# Patient Record
Sex: Female | Born: 1935 | Race: Black or African American | Hispanic: No | Smoking: Current every day smoker
Health system: Southern US, Community
[De-identification: ages and names within clinical notes are randomized; demographics above are authoritative.]

## PROBLEM LIST (undated history)

## (undated) DIAGNOSIS — K219 Gastro-esophageal reflux disease without esophagitis: Secondary | ICD-10-CM

## (undated) DIAGNOSIS — I10 Essential (primary) hypertension: Secondary | ICD-10-CM

## (undated) DIAGNOSIS — F0390 Unspecified dementia without behavioral disturbance: Secondary | ICD-10-CM

## (undated) DIAGNOSIS — I82409 Acute embolism and thrombosis of unspecified deep veins of unspecified lower extremity: Secondary | ICD-10-CM

## (undated) HISTORY — PX: APPENDECTOMY: SHX54

## (undated) HISTORY — PX: ABDOMINAL HYSTERECTOMY: SHX81

---

## 1999-04-18 ENCOUNTER — Emergency Department (HOSPITAL_COMMUNITY): Admission: EM | Admit: 1999-04-18 | Discharge: 1999-04-18 | Payer: Self-pay | Admitting: Emergency Medicine

## 1999-04-18 ENCOUNTER — Encounter: Payer: Self-pay | Admitting: Emergency Medicine

## 1999-04-28 ENCOUNTER — Emergency Department (HOSPITAL_COMMUNITY): Admission: EM | Admit: 1999-04-28 | Discharge: 1999-04-28 | Payer: Self-pay | Admitting: Emergency Medicine

## 2001-10-09 ENCOUNTER — Encounter: Admission: RE | Admit: 2001-10-09 | Discharge: 2001-10-09 | Payer: Self-pay | Admitting: Cardiology

## 2001-10-09 ENCOUNTER — Encounter: Payer: Self-pay | Admitting: Cardiology

## 2001-12-26 ENCOUNTER — Encounter: Payer: Self-pay | Admitting: Cardiology

## 2001-12-26 ENCOUNTER — Ambulatory Visit (HOSPITAL_COMMUNITY): Admission: RE | Admit: 2001-12-26 | Discharge: 2001-12-26 | Payer: Self-pay | Admitting: Cardiology

## 2002-01-01 ENCOUNTER — Ambulatory Visit (HOSPITAL_COMMUNITY): Admission: RE | Admit: 2002-01-01 | Discharge: 2002-01-01 | Payer: Self-pay | Admitting: Cardiology

## 2002-03-17 ENCOUNTER — Emergency Department (HOSPITAL_COMMUNITY): Admission: EM | Admit: 2002-03-17 | Discharge: 2002-03-17 | Payer: Self-pay | Admitting: Emergency Medicine

## 2002-03-17 ENCOUNTER — Encounter: Payer: Self-pay | Admitting: Emergency Medicine

## 2003-06-25 ENCOUNTER — Encounter: Admission: RE | Admit: 2003-06-25 | Discharge: 2003-06-25 | Payer: Self-pay | Admitting: Cardiology

## 2003-06-25 ENCOUNTER — Encounter: Payer: Self-pay | Admitting: Cardiology

## 2008-10-30 ENCOUNTER — Emergency Department (HOSPITAL_COMMUNITY): Admission: EM | Admit: 2008-10-30 | Discharge: 2008-10-31 | Payer: Self-pay | Admitting: Emergency Medicine

## 2009-03-24 ENCOUNTER — Inpatient Hospital Stay (HOSPITAL_COMMUNITY): Admission: EM | Admit: 2009-03-24 | Discharge: 2009-04-01 | Payer: Self-pay | Admitting: Emergency Medicine

## 2009-05-14 ENCOUNTER — Emergency Department (HOSPITAL_COMMUNITY): Admission: EM | Admit: 2009-05-14 | Discharge: 2009-05-14 | Payer: Self-pay | Admitting: Emergency Medicine

## 2009-05-21 ENCOUNTER — Emergency Department (HOSPITAL_COMMUNITY): Admission: EM | Admit: 2009-05-21 | Discharge: 2009-05-21 | Payer: Self-pay | Admitting: Emergency Medicine

## 2009-06-06 ENCOUNTER — Emergency Department (HOSPITAL_COMMUNITY): Admission: EM | Admit: 2009-06-06 | Discharge: 2009-06-07 | Payer: Self-pay | Admitting: Emergency Medicine

## 2009-06-14 ENCOUNTER — Ambulatory Visit (HOSPITAL_COMMUNITY): Admission: RE | Admit: 2009-06-14 | Discharge: 2009-06-14 | Payer: Self-pay | Admitting: Orthopedic Surgery

## 2010-11-14 IMAGING — CR DG HUMERUS 2V *L*
4 series · 4 of 4 positions shown · non-contrast
Comparison: None available.

CLINICAL DATA: Fall, pain.

LEFT HUMERUS - 2+ VIEW

[w humerus ap left *]
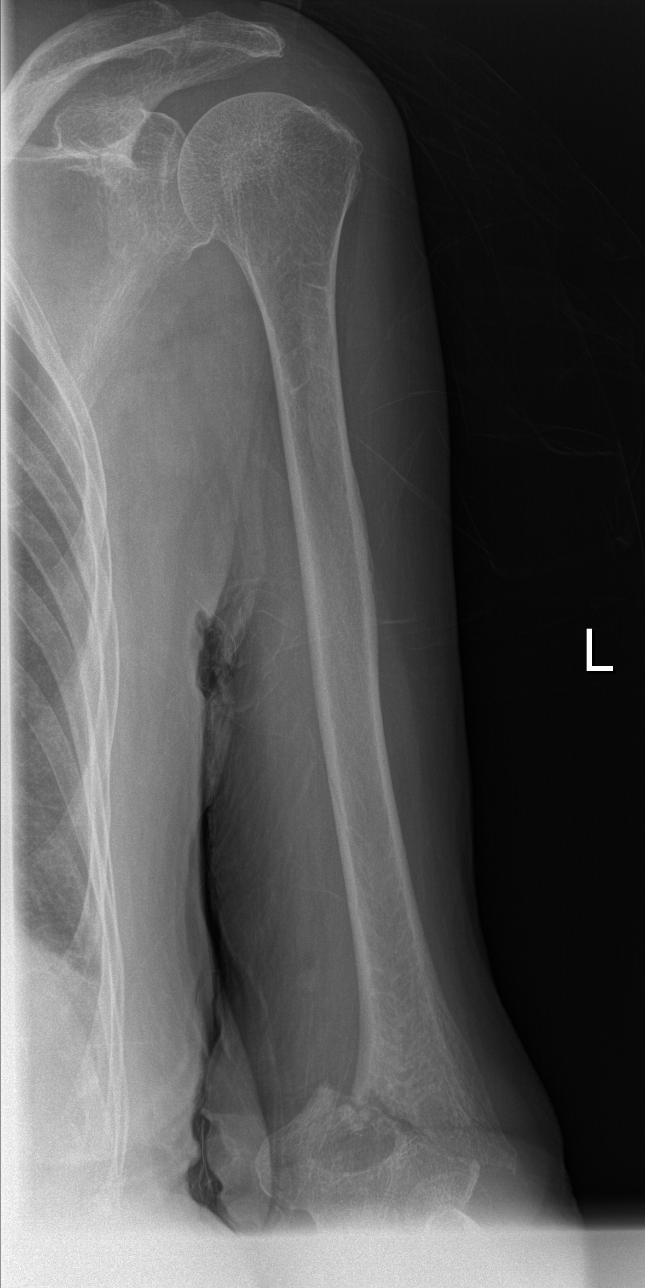

[w humerus lat left *]
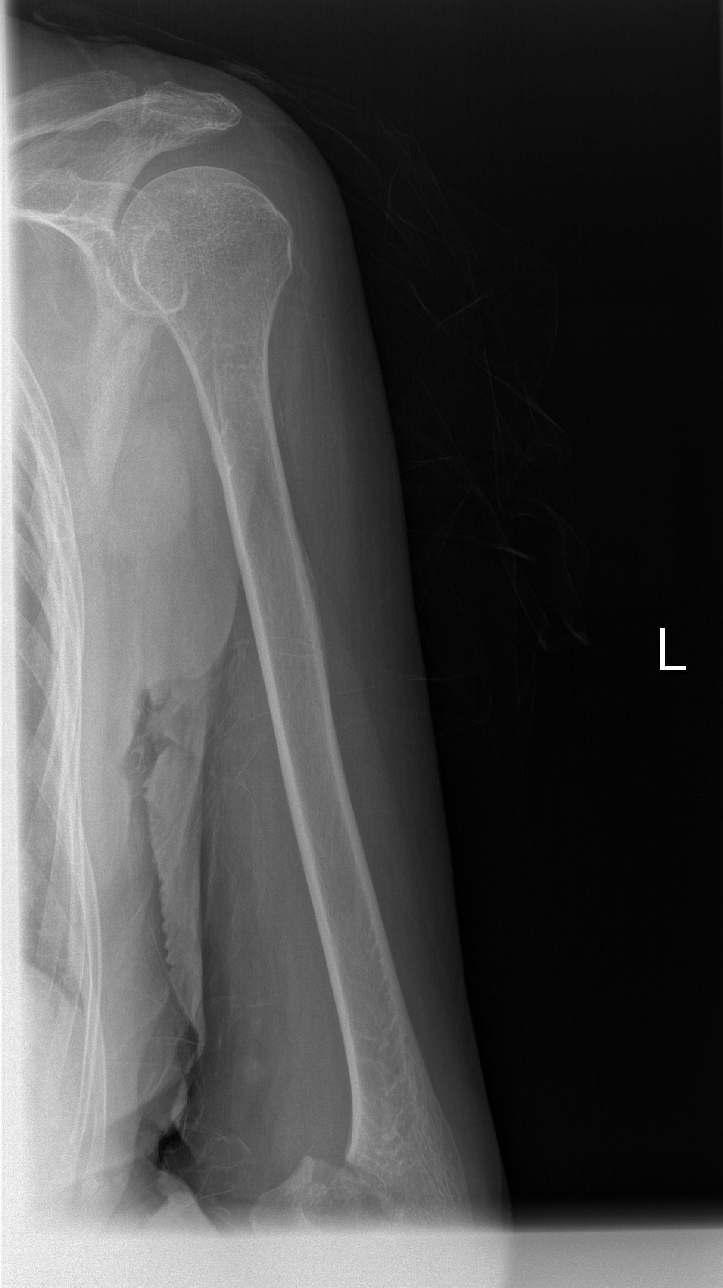

[view not recorded (1 of 2)]
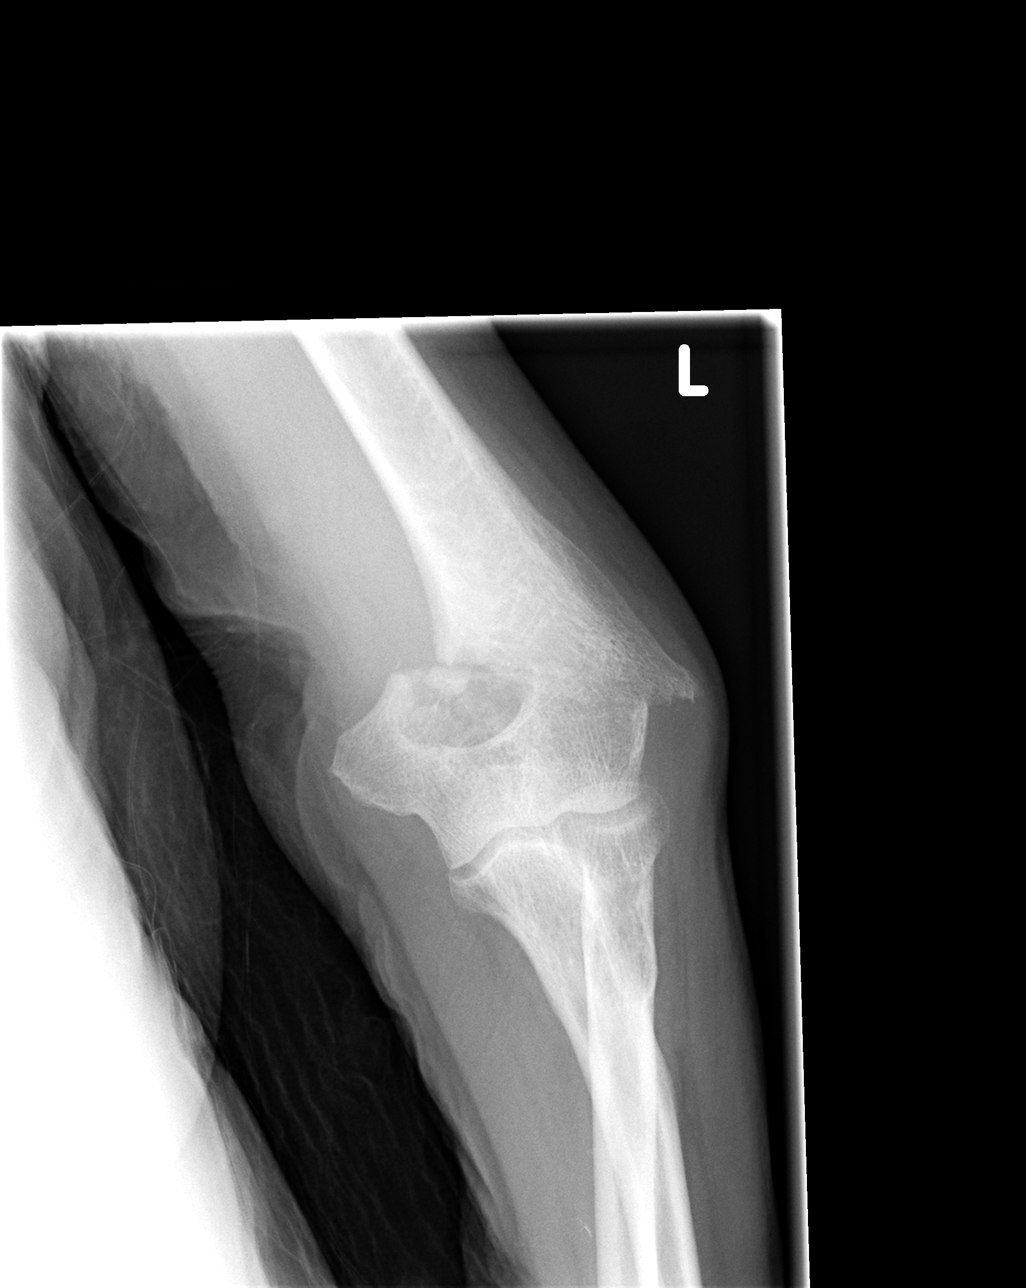

[view not recorded (2 of 2)]
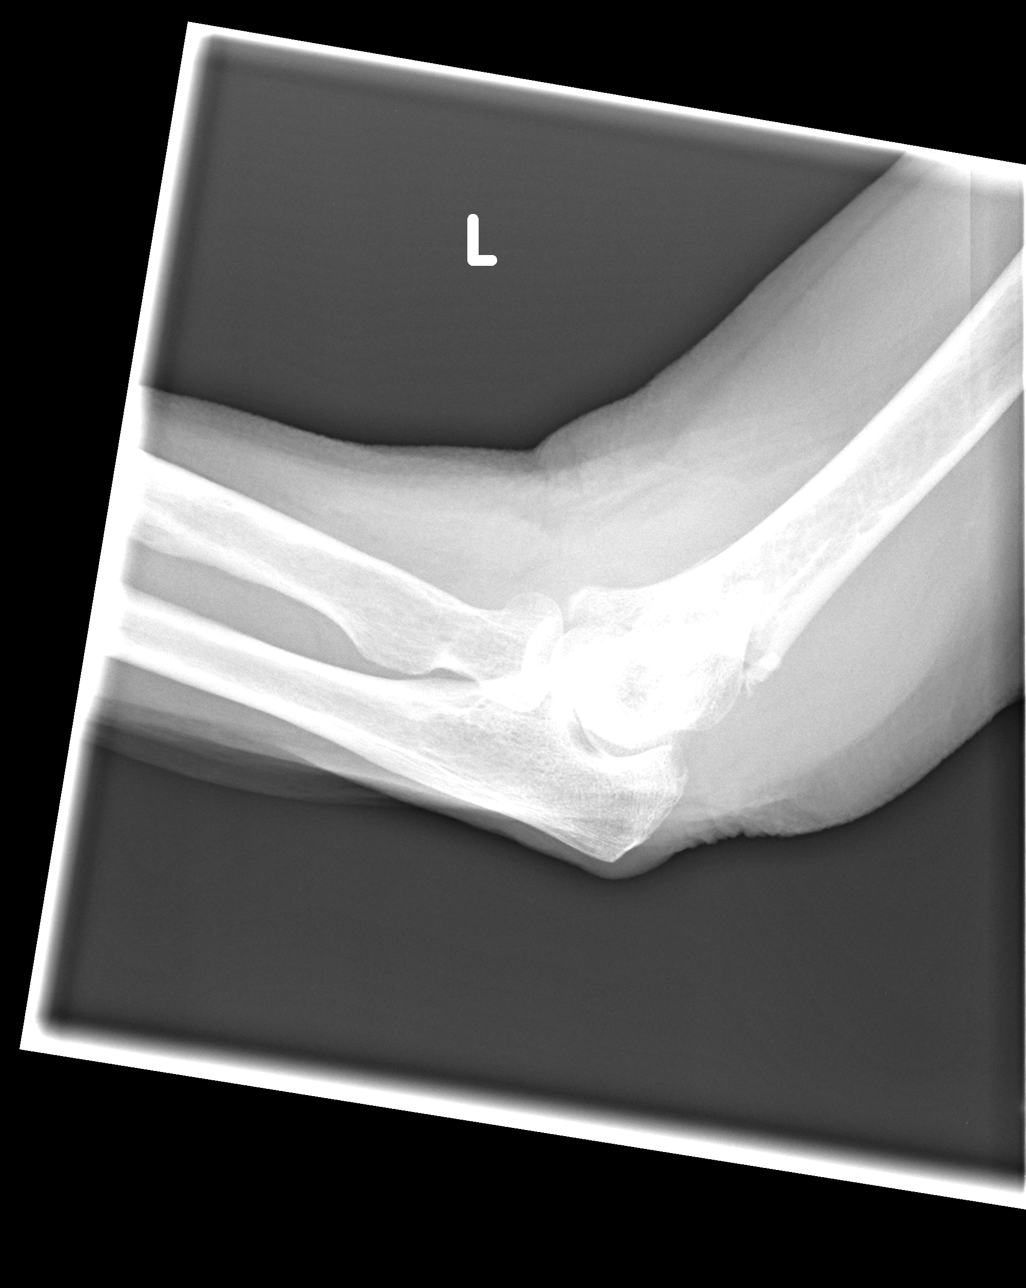

[4 of 4 positions shown; findings below may reference images not displayed]

FINDINGS: The patient has a supracondylar fracture of the distal
left humerus with approximately 1.2 cm medial displacement.
Associated soft tissue swelling is noted.  Elbow joint effusion is
seen.
IMPRESSION: Supracondylar fracture distal left humerus.

## 2010-11-29 ENCOUNTER — Encounter
Admission: RE | Admit: 2010-11-29 | Discharge: 2010-11-29 | Payer: Self-pay | Source: Home / Self Care | Attending: Cardiology | Admitting: Cardiology

## 2010-12-19 ENCOUNTER — Inpatient Hospital Stay (HOSPITAL_COMMUNITY)
Admit: 2010-12-19 | Discharge: 2010-12-22 | DRG: 641 | Disposition: A | Discharge status: Home Health | Payer: Medicare Other | Source: Ambulatory Visit | Attending: Cardiology | Admitting: Cardiology

## 2010-12-19 ENCOUNTER — Inpatient Hospital Stay (HOSPITAL_COMMUNITY): Payer: Medicare Other

## 2010-12-19 DIAGNOSIS — R222 Localized swelling, mass and lump, trunk: Secondary | ICD-10-CM | POA: Diagnosis present

## 2010-12-19 DIAGNOSIS — E46 Unspecified protein-calorie malnutrition: Principal | ICD-10-CM | POA: Diagnosis present

## 2010-12-19 DIAGNOSIS — M25579 Pain in unspecified ankle and joints of unspecified foot: Secondary | ICD-10-CM | POA: Diagnosis not present

## 2010-12-19 DIAGNOSIS — F0390 Unspecified dementia without behavioral disturbance: Secondary | ICD-10-CM | POA: Diagnosis present

## 2010-12-19 DIAGNOSIS — R64 Cachexia: Secondary | ICD-10-CM | POA: Diagnosis present

## 2010-12-19 DIAGNOSIS — I10 Essential (primary) hypertension: Secondary | ICD-10-CM | POA: Diagnosis present

## 2010-12-19 DIAGNOSIS — F17200 Nicotine dependence, unspecified, uncomplicated: Secondary | ICD-10-CM | POA: Diagnosis present

## 2010-12-19 DIAGNOSIS — I959 Hypotension, unspecified: Secondary | ICD-10-CM | POA: Diagnosis present

## 2010-12-19 LAB — COMPREHENSIVE METABOLIC PANEL
Alkaline Phosphatase: 160 U/L — ABNORMAL HIGH (ref 39–117)
BUN: 3 mg/dL — ABNORMAL LOW (ref 6–23)
Chloride: 103 mEq/L (ref 96–112)
Glucose, Bld: 107 mg/dL — ABNORMAL HIGH (ref 70–99)
Potassium: 4.8 mEq/L (ref 3.5–5.1)
Total Bilirubin: 0.2 mg/dL — ABNORMAL LOW (ref 0.3–1.2)

## 2010-12-19 LAB — CBC
HCT: 34.6 % — ABNORMAL LOW (ref 36.0–46.0)
MCV: 91.1 fL (ref 78.0–100.0)
RBC: 3.8 MIL/uL — ABNORMAL LOW (ref 3.87–5.11)
WBC: 4.2 10*3/uL (ref 4.0–10.5)

## 2010-12-20 ENCOUNTER — Inpatient Hospital Stay (HOSPITAL_COMMUNITY): Payer: Medicare Other

## 2010-12-20 LAB — URINALYSIS, ROUTINE W REFLEX MICROSCOPIC
Ketones, ur: NEGATIVE mg/dL
Nitrite: NEGATIVE
Protein, ur: NEGATIVE mg/dL

## 2010-12-20 LAB — PREALBUMIN: Prealbumin: 12.4 mg/dL — ABNORMAL LOW (ref 17.0–34.0)

## 2010-12-20 MED ORDER — IOHEXOL 300 MG/ML  SOLN
100.0000 mL | Freq: Once | INTRAMUSCULAR | Status: AC | PRN
  Administered 2010-12-20: 100 mL via INTRAVENOUS

## 2010-12-21 LAB — BASIC METABOLIC PANEL
BUN: 2 mg/dL — ABNORMAL LOW (ref 6–23)
Chloride: 105 mEq/L (ref 96–112)
Creatinine, Ser: 0.5 mg/dL (ref 0.4–1.2)
GFR calc non Af Amer: >60 mL/min (ref >60)

## 2011-02-12 LAB — APTT: aPTT: 26 seconds (ref 24–37)

## 2011-02-12 LAB — CBC
HCT: 41.9 % (ref 36.0–46.0)
Hemoglobin: 13.9 g/dL (ref 12.0–15.0)
MCHC: 33.1 g/dL (ref 30.0–36.0)
MCV: 94.5 fL (ref 78.0–100.0)
Platelets: 186 10*3/uL (ref 150–400)
RBC: 4.43 MIL/uL (ref 3.87–5.11)
RDW: 19.5 % — ABNORMAL HIGH (ref 11.5–15.5)
WBC: 3.3 10*3/uL — ABNORMAL LOW (ref 4.0–10.5)

## 2011-02-12 LAB — POCT I-STAT, CHEM 8
BUN: 6 mg/dL (ref 6–23)
Calcium, Ion: 1.08 mmol/L — ABNORMAL LOW (ref 1.12–1.32)
Chloride: 95 mEq/L — ABNORMAL LOW (ref 96–112)
Creatinine, Ser: 0.5 mg/dL (ref 0.4–1.2)
Glucose, Bld: 89 mg/dL (ref 70–99)
HCT: 43 % (ref 36.0–46.0)
Hemoglobin: 14.6 g/dL (ref 12.0–15.0)
Potassium: 4.1 mEq/L (ref 3.5–5.1)
Sodium: 130 mEq/L — ABNORMAL LOW (ref 135–145)
TCO2: 22 mmol/L (ref 0–100)

## 2011-02-12 LAB — BASIC METABOLIC PANEL
CO2: 25 mEq/L (ref 19–32)
Calcium: 9 mg/dL (ref 8.4–10.5)
Creatinine, Ser: 0.54 mg/dL (ref 0.4–1.2)
GFR calc Af Amer: >60 mL/min (ref >60)

## 2011-02-12 LAB — PROTIME-INR: Prothrombin Time: 12.8 seconds (ref 11.6–15.2)

## 2011-02-12 LAB — ETHANOL: Alcohol, Ethyl (B): 223 mg/dL — ABNORMAL HIGH (ref 0–10)

## 2011-02-14 LAB — BASIC METABOLIC PANEL
BUN: 11 mg/dL (ref 6–23)
BUN: 2 mg/dL — ABNORMAL LOW (ref 6–23)
BUN: 5 mg/dL — ABNORMAL LOW (ref 6–23)
CO2: 24 mEq/L (ref 19–32)
Calcium: 8.7 mg/dL (ref 8.4–10.5)
Calcium: 8.9 mg/dL (ref 8.4–10.5)
Chloride: 104 mEq/L (ref 96–112)
Chloride: 105 mEq/L (ref 96–112)
Creatinine, Ser: 0.51 mg/dL (ref 0.4–1.2)
Creatinine, Ser: 0.52 mg/dL (ref 0.4–1.2)
Creatinine, Ser: 0.69 mg/dL (ref 0.4–1.2)
GFR calc Af Amer: >60 mL/min (ref >60)
GFR calc non Af Amer: >60 mL/min (ref >60)
Glucose, Bld: 80 mg/dL (ref 70–99)
Glucose, Bld: 88 mg/dL (ref 70–99)
Potassium: 3.9 mEq/L (ref 3.5–5.1)

## 2011-02-14 LAB — DIFFERENTIAL
Blasts: 0 %
Lymphocytes Relative: 21 % (ref 12–46)
Lymphs Abs: 0.9 10*3/uL (ref 0.7–4.0)
Myelocytes: 0 %
Neutro Abs: 3.2 10*3/uL (ref 1.7–7.7)
Neutrophils Relative %: 79 % — ABNORMAL HIGH (ref 43–77)
Promyelocytes Absolute: 0 %

## 2011-02-14 LAB — CROSSMATCH

## 2011-02-14 LAB — CBC
HCT: 26.1 % — ABNORMAL LOW (ref 36.0–46.0)
HCT: 27.9 % — ABNORMAL LOW (ref 36.0–46.0)
HCT: 30.9 % — ABNORMAL LOW (ref 36.0–46.0)
Hemoglobin: 8.2 g/dL — ABNORMAL LOW (ref 12.0–15.0)
Hemoglobin: 8.9 g/dL — ABNORMAL LOW (ref 12.0–15.0)
MCHC: 29.5 g/dL — ABNORMAL LOW (ref 30.0–36.0)
MCHC: 31.4 g/dL (ref 30.0–36.0)
MCHC: 31.5 g/dL (ref 30.0–36.0)
MCHC: 31.8 g/dL (ref 30.0–36.0)
MCV: 76.3 fL — ABNORMAL LOW (ref 78.0–100.0)
MCV: 77.2 fL — ABNORMAL LOW (ref 78.0–100.0)
MCV: 77.6 fL — ABNORMAL LOW (ref 78.0–100.0)
MCV: 78.5 fL (ref 78.0–100.0)
MCV: 78.9 fL (ref 78.0–100.0)
Platelets: 105 10*3/uL — ABNORMAL LOW (ref 150–400)
Platelets: 105 10*3/uL — ABNORMAL LOW (ref 150–400)
Platelets: 173 10*3/uL (ref 150–400)
Platelets: 214 10*3/uL (ref 150–400)
RBC: 3.43 MIL/uL — ABNORMAL LOW (ref 3.87–5.11)
RBC: 3.68 MIL/uL — ABNORMAL LOW (ref 3.87–5.11)
RBC: 4.08 MIL/uL (ref 3.87–5.11)
RDW: 25.6 % — ABNORMAL HIGH (ref 11.5–15.5)
RDW: 25.8 % — ABNORMAL HIGH (ref 11.5–15.5)
WBC: 5.1 10*3/uL (ref 4.0–10.5)
WBC: 5.5 10*3/uL (ref 4.0–10.5)
WBC: 6.1 10*3/uL (ref 4.0–10.5)

## 2011-02-14 LAB — POCT CARDIAC MARKERS: Myoglobin, poc: 47.1 ng/mL (ref 12–200)

## 2011-02-14 LAB — HEMOCCULT GUIAC POC 1CARD (OFFICE): Fecal Occult Bld: NEGATIVE

## 2011-02-14 LAB — RETICULOCYTES
RBC.: 3.03 MIL/uL — ABNORMAL LOW (ref 3.87–5.11)
Retic Count, Absolute: 24.2 10*3/uL (ref 19.0–186.0)
Retic Ct Pct: 0.8 % (ref 0.4–3.1)

## 2011-02-14 LAB — URINALYSIS, ROUTINE W REFLEX MICROSCOPIC
Bilirubin Urine: NEGATIVE
Ketones, ur: 15 mg/dL — AB
Nitrite: NEGATIVE
Protein, ur: NEGATIVE mg/dL
Specific Gravity, Urine: 1.012 (ref 1.005–1.030)
Urobilinogen, UA: 1 mg/dL (ref 0.0–1.0)

## 2011-02-14 LAB — URINE MICROSCOPIC-ADD ON

## 2011-02-14 LAB — PROTIME-INR: Prothrombin Time: 14.9 seconds (ref 11.6–15.2)

## 2011-02-14 LAB — BRAIN NATRIURETIC PEPTIDE: Pro B Natriuretic peptide (BNP): 58 pg/mL (ref 0.0–100.0)

## 2011-02-14 LAB — IRON AND TIBC: Iron: 44 ug/dL (ref 42–135)

## 2011-02-20 NOTE — Discharge Summary (Signed)
  NAMEMIKAELA, HILGEMAN                ACCOUNT NO.:  [id number]  MEDICAL RECORD NO.:  [id number]           PATIENT TYPE:  I  LOCATION:  2023                         FACILITY:  [facility]  PHYSICIAN:  Osvaldo Shipper. Percell Lamboy, M.D.DATE OF BIRTH:  [birth year]  DATE OF ADMISSION:  12/19/2010 DATE OF DISCHARGE:  12/22/2010                              DISCHARGE SUMMARY   DISCHARGE DIAGNOSES: 1. Lung mass. 2. Malnutrition. 3. Dementia. 4. Tobacco abuse. 5. Hypotension.  Ms. Palladino is an 76 year old patient who has the history of loss of appetite.  During an outpatient evaluation, the patient was found to have a left upper lobe lung lesion that was suspicious for carcinoma. The patient also had cough, dyspnea, weakness, and she is subsequently admitted for evaluation and treatment of this particular problem.  It is of note that on examination, the patient's lungs were mostly clear. There were no wheezes.  There were few rhonchi.  Otherwise, examination was essentially within normal range.  HOSPITAL COURSE:  The patient was admitted to the Medical Service.  The patient was placed on IV antibiotic therapy and was cautiously hydrated.  The patient had a CT scan of the chest, which revealed a 7 x 15 mm left upper lobe suprapleural opacity.  There was also a 1 x 2 cm prevascular lymph node and evidence of mild emphysema.  It was suggested that the patient have a followup CT in 6 months or a PET CT.  There were also some problems with malnutrition and confusion.  The patient was placed on Aricept 5 mg daily.  A consultation was obtained with PT and Occupational Therapy.  On December 22, 2010, it was the opinion that the patient had received maximum benefit from this hospitalization and could be discharged home with completion of her workup as an outpatient.  MEDICATIONS AT DISCHARGE: 1. Acetaminophen 650 mg every 4 hours as needed. 2. Aricept 5 mg daily. 3. Ferrous sulfate 325 mg daily. 4.  Multiple vitamin daily.  The patient is to stop the amlodipine and benazepril for now, and this will be reassessed upon evaluation in the office.  The patient is to notify the physician immediately if any changes, problems, or concerns.     Ivery Quale, P.A.   ______________________________ Osvaldo Shipper. Stefanee Mckell, M.D.    HB/MEDQ  D:  01/04/2011  T:  01/05/2011  Job:  [id number]  Electronically Signed by Ivery Quale P.A. on 01/12/2011 08:32:23 AM Electronically Signed by Donia Guiles M.D. on 02/20/2011 08:42:47 PM

## 2011-03-21 NOTE — Op Note (Signed)
NAMEAMARIANA, Ann Kelley             ACCOUNT NO.:  [id number]   MEDICAL RECORD NO.:  [id number]          PATIENT TYPE:  AMB   LOCATION:  SDS                          FACILITY:  [facility]   PHYSICIAN:  Feliberto Gottron. Turner Daniels, M.D.   DATE OF BIRTH:  12-30-1925   DATE OF PROCEDURE:  06/14/2009  DATE OF DISCHARGE:  06/14/2009                               OPERATIVE REPORT   PREOPERATIVE DIAGNOSIS:  Comminuted left elbow supracondylar fracture.   POSTOPERATIVE DIAGNOSIS:  Comminuted left elbow supracondylar fracture.   PROCEDURE:  Open reduction and internal fixation using DePuy elbow  plating system, 7-hole posterolateral plate, and a 7-hole medial plate.   SURGEON:  Feliberto Gottron. Turner Daniels, MD   FIRST ASSISTANT:  Shirl Harris, PA-C   ANESTHETIC:  General endotracheal.   ESTIMATED BLOOD LOSS:  Minimal.   FLUID REPLACEMENT:  1200 mL of crystalloid.   DRAINS PLACED:  None.   TOURNIQUET TIME:  1 hour and 50 minutes.   INDICATIONS FOR PROCEDURE:  An 77 year old woman who fell about a week  ago and sustained a closed left elbow supracondylar fracture that did  not involve the intra-articular surface but was comminuted at the  fracture site just above the olecranon fossa.  It had a slight oblique  angle to it and was grossly unstable.  We initially we were going to do  [facility] a few days after her injury, however, Anesthesia  evaluation felt that she be more suitable for surgery at the [facility].  Therefore, surgery is done at the [facility] on June 14, 2009.  Risks and benefits of surgery were discussed with the patient and  with her daughter who is her caretaker and the goal is to give her a  stable elbow with rigid fixation to allow relatively early motion.   DESCRIPTION OF PROCEDURE:  The patient was identified by armband and  received preoperative IV antibiotics in the holding area at [facility], taken to operating room 1.  Appropriate anesthetic monitors  were  attached and general endotracheal anesthesia induced with the  patient in supine position.  Tourniquet applied high to the left upper  extremity which was then prepped and draped in usual sterile fashion  from the wrist to the hemithorax.  Time-out procedure was performed and  then a Mayo stand was brought from the other side of the table, allowing  [place] to put the forearm in the Mayo stand holding the elbow flexed  anywhere from about 90-95 degrees.  The limb was wrapped with an Esmarch  bandage.  The tourniquet inflated to 300 mmHg.  We began the operation  by making a posterior midline incision starting about 6-7 cm distal to  the olecranon tip going over the olecranon and then proximal for another  10-12 cm through the skin and subcutaneous tissue to the fascia  investing the ulna and the triceps.  The fascia was then divided and  reflected medially and laterally exposing the proximal ulna, olecranon,  and the triceps tendon.  We then exploited the interval between the  triceps tendon laterally and  the lateral intermuscular septum getting [place]  down to the fracture site.  There was already some granulation tissue  which was removed.  We then went to the medial side.  I released the  ulnar nerve and transposed it anteriorly using a vessel loop.  We then  split the interval between the triceps and the medial intermuscular  septum exposing the fracture medially.  We were able to visualize the  fracture well enough from the medial and lateral side to perform a  reduction under direct vision.  It was then held with 0.62 K-wires going  up the medial and lateral columns.  Satisfied with the reduction, we  then selected a 7-hole posterolateral plate from the DePuy elbow set and  applied it to the posterior aspect of the lateral column and fixed it  their provisionally with the central gliding hole with a 3.5 screw with  a washer.  Satisfied with the position, we then placed 2 locking screws  from  the plate into the capitellum obtaining good firm fixation.  We  then placed another 4 screws proximally bicortical fixing the plate to  the lateral column and once this had been accomplished, the elbow was  actually quite rigidly fixed.  At this point, the lateral K-wire was  removed.  Moving to the medial side, we again checked our reduction and  then selected a 7-hole medial plate from the elbow plating set, applied  it to the medial cortex again with a gliding hole to set our location  over the medial epicondyle, and then placed 3 medial epicondyle screws  under C-arm imaging control, obtaining good firm fixation of the medial  column and then filled in the rest of the screw holes proximally on the  plate with bicortical screws except for the most proximal hole giving [place]  8 cortices proximally and 6 distally.  The elbow was then taken through  range of motion, and no impingement was noted.  We were able to get a  finger up into the anterior aspect of the elbow and confirmed that none  of the screws had penetrated.  C-arm images were taken confirming good  position of the plates and screws.  At this point, the tourniquet was  let down.  Small bleeders were identified and cauterized.  The  subcutaneous tissue was closed with running 2-0 Vicryl suture, and skin  with running interlocking 3-0 nylon suture.  A dressing of Xeroform, 4x4  dressing, sponges, Webril, and an Ace wrap was applied followed by a  sling.  The patient was then awakened and taken to the recovery room  without difficulty.      Feliberto Gottron. Turner Daniels, M.D.  Electronically Signed     FJR/MEDQ  D:  06/14/2009  T:  06/15/2009  Job:  [id number]

## 2011-03-21 NOTE — Op Note (Signed)
Ann Kelley, Ann Kelley             ACCOUNT NO.:  [id number]   MEDICAL RECORD NO.:  [id number]          PATIENT TYPE:  INP   LOCATION:  1236                         FACILITY:  [facility]   PHYSICIAN:  Bernette Redbird, M.D.   DATE OF BIRTH:  [birth year]   DATE OF PROCEDURE:  03/26/2009  DATE OF DISCHARGE:                               OPERATIVE REPORT   PROCEDURE:  Upper endoscopy.   INDICATIONS:  An 77 year old African American female with profound  microcytic anemia on admission, recurrently Hemoccult negative, history  of significant aspirin exposure and moderate ethanol consumption.   FINDINGS:  Erythema in the antral region and prepyloric region  suggestive of gastric antral vascular ectasia.  No erosive changes,  ulcers or cancer seen.   PROCEDURE:  The patient's daughter provided written consent on her  mother's behalf.  The patient was brought from her hospital room to the  endoscopy unit.  No sedation was administered because the patient was  already quite somnolent from being on an Ativan and alcohol withdrawal  protocol.  With the patient in the left lateral decubitus position, the  Pentax adult video endoscope was passed under direct vision.  It entered  the esophagus without difficulty.  The vocal cords were not well seen.   The esophagus was pertinent for a widely patent esophageal mucosal ring  (Schatzki's ring) at the squamocolumnar junction.  No varices, reflux  esophagitis, Barrett's esophagus, infection or neoplasia were noted in  the esophagus.   A small hiatal hernia was present.   The stomach contained no blood or coffee-ground material.   The main finding on examination of the stomach was intense erythematous  mucosal change in the prepyloric and antral region.  This had a somewhat  reticular pattern, consistent with gastric antral vascular ectasia.  I  worked carefully but did not see any erosive changes or ulcers, nor did  I see any polyps or masses, including  a retroflexed view of the cardia.  There was a little bit of patchy erythema in the midbody of the stomach.  The duodenal bulb and second duodenum were unremarkable.   The scope was removed from the patient.  No biopsies were obtained.  She  tolerated the procedure well, and there were no apparent complications.   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   PLAN:  1. I have discussed the case with her primary physician, Dr. Kevin Fenton.      Spruill.  We both feel that colonoscopic evaluation would be      appropriate if we can get the patient's daughter to agree to it.      This would have to be deferred until the patient is somewhat more      lucid.  2. The observed findings are not characteristic of a typical peptic      process.  Nonetheless, aspirin avoidance and chronic PPI therapy      might help diminish blood loss from the stomach.  3. It is unclear whether or not the observed findings account for the  patient's anemia, but I think that there is a      good chance that is the case.  4. Management of this patient's anemia over the long haul will      probably need to include iron supplementation and periodic      hemoglobin determinations.           ______________________________  Bernette Redbird, M.D.     RB/MEDQ  D:  03/26/2009  T:  03/26/2009  Job:  [id number]   cc:   Osvaldo Shipper. Spruill, M.D.  Fax: [phone]

## 2011-03-21 NOTE — H&P (Signed)
Ann Kelley, Ann Kelley             ACCOUNT NO.:  [id number]   MEDICAL RECORD NO.:  [id number]          PATIENT TYPE:  INP   LOCATION:  0113                         [facility]ACILITY:  [facility]   PHYSICIAN:  Osvaldo Shipper. Spruill, M.D.DATE OF BIRTH:  [birth year]   DATE OF ADMISSION:  03/24/2009  DATE OF DISCHARGE:                              HISTORY & PHYSICAL   CHIEF COMPLAINT:  Weakness, dizziness.   HISTORY OF PRESENT ILLNESS:  This 77 year old black woman was brought to  [facility] for further evaluation of severe weakness as well  as history of syncope. The patient's family gives most of the history  and states that for the past 3-4 months their mother has had difficulty  with syncope.  The patient is described as having a severe coughing  paroxysm prior to a syncopal episode which is described as her eyes roll  back in her head and she becomes unresponsive.   The patient has had this for the past 3-4 months and also has had  associated symptoms of profound weakness, dizziness and weight loss as  well as poor appetite.  The patient gives no history of black tarry  stools.  There is no history of gastrointestinal or gum bleeding or  bleeding from any other orifice.   The patient also does not describe any history of [employer] colored  urine and has no history of diabetes.  The patient was initially seen in  January of this year and was recommended to have several studies to  evaluate her history of syncope but she refused to have any studies  done, did not follow up with the planned CT scan of the brain.   The patient was brought to the hospital and was evaluated by the  emergency room physician and found to have a severe anemia with  hemoglobin 5.3.  With this she was referred for admission to the  hospital.  The patient is an extremely poor historian and the majority  of the patient's history is obtained from her family who also reports  that she has had some confusion at times  but no other major problems.  They also reported that she has some mild left lower quadrant pain which  may have occurred during a previous fall.   The patient will be admitted to the hospital for further evaluation of  her anemia and to evaluate her complaints of syncope.   PAST MEDICAL HISTORY:  Significant for previous hypertension.   SOCIAL HISTORY:  She currently lives with one of her daughters and is  not working.   FAMILY HISTORY:  Noncontributory.   REVIEW OF SYSTEMS:  NEUROLOGIC:  As mentioned before in the HPI, recent  syncope with her eyes rolling back in her head.  CARDIOVASCULAR:  No  complaints of chest pain.  RESPIRATORY:  Does complain of recurrent  exertional and at rest dyspnea and a productive cough.  ENDOCRINE:  Unremarkable.  DERMATOLOGIC:  There are no complaints of any skin rash,  no bruises.  GASTROINTESTINAL:  Poor appetite as described above.  No  vomiting.  The remainder of the review of systems  unremarkable.   MEDICATIONS:  Include:  1. Amlodipine/benazepril 10/20 one daily.  2. Klor-Con 1 p.o. daily.   ALLERGIES:  None known.   PHYSICAL EXAMINATION:  VITAL SIGNS:  Temp 97.6, blood pressure 89/51,  heart rate 78, respiratory rate 18.  O2 saturation 99%.  GENERAL:  A well-developed elderly woman examined in moderate distress.  EXAMINATION OF THE HEAD/EARS/EYES/NOSE/THROAT:  Shows pale conjunctiva.  Sclerae nonicteric.  NECK:  No nuchal rigidity.  No palpable nodes.  No carotid bruit.  No  thyromegaly.  CHEST:  No chest wall tenderness appreciated.  No intercostal  retractions.  The patient has slightly increased expiratory phase of  respiration.  CARDIOVASCULAR:  S1, S2 normal.  No S 3, no rubs, no lifts, no thrills.  ABDOMEN:  Soft, no active bowel sounds.  Unable to elicit any  tenderness.  EXTREMITIES:  No cyanosis, no clubbing, no edema.  The patient does have  some tenderness on the lateral portion of the left thigh.  No bruise  noted.    LABORATORY STUDIES:  The patient's white blood cell count 4.1,  hemoglobin 5.3 gm/dL, hematocrit 16%, MCV 10.9, MCHC 29.5.  Patient's  ABO Rh screen O positive.  Serum sodium 134, potassium 4.4, chloride  105, CO2 of 22, glucose 80, BUN 11, creatinine 0.69, calcium 8.7.  Other  labs are currently pending.   ASSESSMENT:  1. Syncope.  May be secondary to #2.  2. Anemia.  The anemia may be a blood loss type.  Will need to be      evaluated.  3. Hypotension, likely due to severe anemia.  4. Probable chronic obstructive pulmonary disease.  The patient has a      history of smoking two packs per day.  5. Confusion.   PLAN:  1. We will admit the patient to the hospital to evaluate the causes of      anemia.  May include a gastrointestinal evaluation to exclude any      occult blood loss.  2. We will evaluate the patient's brain for other possible causes of      syncope and also to exclude the possibility of subdural hematoma as      she has fallen several times.  3. We will improve patient's pulmonary toilet as well.  4. We will evaluate her loss of appetite.      Osvaldo Shipper. Spruill, M.D.  Electronically Signed     JOS/MEDQ  D:  03/24/2009  T:  03/24/2009  Job:  [id number]

## 2011-03-21 NOTE — Consult Note (Signed)
Ann Kelley, Ann Kelley             ACCOUNT NO.:  [id number]   MEDICAL RECORD NO.:  [id number]          PATIENT TYPE:  INP   LOCATION:  1236                         FACILITY[facility]  [facility]   PHYSICIAN:  Shirley Friar, MDDATE OF BIRTH:  [birth year]   DATE OF CONSULTATION:  03/25/2009  DATE OF DISCHARGE:                                 CONSULTATION   REFERRING PHYSICIAN:  Dr. Donia Guiles.   We were asked to see Ann Kelley today in consult for anemia by Dr. Donia Guiles.   HPI:  This is an 77 year old female who was admitted with a hemoglobin  of 5.3 and an MCV value of 69.  She has had multiple falls at home and  had an episode of syncope.  She is somewhat confused now so Ann Kelley history  comes from Ann Kelley daughter.  She tells me that Ann Kelley never complains  but has had a decrease in Ann Kelley p.o. intake since Ann Kelley husband died 5  months ago.  She has also had significant weight loss.  Ann Kelley  drinks 4 to 5 beers a day in the evening.  She smokes cigarettes  continuously.  She takes at least 3 [facility] Powder daily.  She also tells me  that Ann Kelley lost Ann Kelley husband of 60 years about 5 months ago and has  been very depressed since.  There has been no colon cancer or ulcer  disease in the family.  Ann Kelley past medical history is significant  only for hypertension.  She is a patient of Dr. Donia Guiles but does  not follow with him regularly.   CURRENT MEDICATIONS:  Include:  1. Amlodipine-benazepril 10/20.  2. Potassium 20 mEq daily.  3. BC Powder 3 times daily.   SHE HAS NO KNOWN DRUG ALLERGIES.   REVIEW OF SYSTEMS:  Negative except for recent falls, anorexia, and  syncope.   SOCIAL HISTORY:  Positive for tobacco and beer.  No hard alcohol or  recreational drug use.   FAMILY HISTORY:  Negative for colon cancer and ulcer disease.   PHYSICAL EXAM:  The patient is awake, slightly disgruntled with me for  interrupting Ann Kelley meal, slightly confused.  HEART:  Has a regular rate and  rhythm.  LUNGS:  Clear but the patient does not take deep inspiration.  ABDOMEN:  Soft, nontender, nondistended with good bowel sounds.  RECTAL EXAM:  She does have external nonthrombosed hemorrhoids.  She  also has what feel like internal hemorrhoids.  Ann Kelley rectal vault is full  of soft brown stool that is guaiac negative on my exam.   LABS:  Significant for an admission hemoglobin of 5.3 with an MCV value  of 69.8.  She is status post 2 units of packed red blood cells and Ann Kelley  hemoglobin is now 8.2/hematocrit of 26.1.  Ann Kelley white count is 4.7,  platelets 96,000.  BMET is within normal limits including a BUN of 11,  creatinine 0.69.  Iron level is 44, TIBC is 348.  Percent sat is 13.  She was noted to be guaiac negative on Mar 24, 2009, as well.   ASSESSMENT:  Dr. Charlott Rakes has seen and examined the patient,  collected a history, and reviewed Ann Kelley chart.  His impression is this is  an 77 year old female with syncope, anorexia, and severe microcytic  anemia who was guaiac negative who does take BC Powder.  We are  uncertain of the etiology of Ann Kelley anemia, particularly given Ann Kelley normal  iron levels and guaiac status but secondary to NSAIDs, alcohol, and  tobacco use she definitely needs endoscopic evaluation so we will plan  for upper endoscopy at 8:30 a.m. with Dr. Bernette Redbird on Mar 26, 2009.  We have discussed this case with Doctors Spruill, Schooler, and  Buccini.   Thanks very much for this consultation.      Stephani Police, [place]      Shirley Friar, MD  Electronically Signed    MLY/MEDQ  D:  03/25/2009  T:  03/25/2009  Job:  [id number]   cc:   Osvaldo Shipper. Spruill, M.D.  Fax: [phone]   Shirley Friar, MD  Fax: [phone]

## 2011-03-21 NOTE — Discharge Summary (Signed)
Ann Kelley, Ann Kelley             ACCOUNT NO.:  [id number]   MEDICAL RECORD NO.:  [id number]          PATIENT TYPE:  INP   LOCATION:  1432                         FACILITY:  [facility]   PHYSICIAN:  Osvaldo Shipper. Spruill, M.D.DATE OF BIRTH:  1926-03-08   DATE OF ADMISSION:  03/24/2009  DATE OF DISCHARGE:  04/01/2009                               DISCHARGE SUMMARY   ADMISSION OR PROVISIONAL DIAGNOSES:  1. Syncope.  2. Severe anemia.  3. Hypotension.  4. Chronic obstructive pulmonary disease.  5. Confusion.   DISCHARGE DIAGNOSES:  1. Severe anemia, likely secondary to blood loss.  2. Syncope secondary to orthostatic hypotension.  3. Hypotension.  4. Chronic obstructive pulmonary disease.  5. Alcohol withdrawal.  6. Confusion.  7. Malnutrition.  8. Gastric erosion.   BRIEF HISTORY AND REASON FOR ADMISSION:  This 77 year old black woman  who refused to come to the hospital was eventually brought to the  hospital by her daughters with a history of a syncopal episode. Patient  was evaluated by the emergency room physician and found to have severe  anemia, hemoglobin in the 5 range.  Patient was evaluated by the EDP and  subsequently admitted to the hospital as she was also noted to be quite  hypotensive in the EDP.   The patient had had several syncopal episodes in the past but refused to  come to the hospital for further evaluation.  She has a history of  alcohol abuse, as well as tobacco abuse and refused admission for fear  that these things would be taken from her.   Due to the patient's recurrent syncope and weakness, she was brought to  the hospital by her daughter for further evaluation and treatment and  subsequently admitted.   LABORATORY STUDIES:  Cardiac markers on initial presentation, troponin  and myoglobin were all unremarkable.  The B-natriuretic peptide was 58,  unremarkable.  Patient had a baseline BMET on Mar 24, 2009, revealed  serum sodium 134, potassium 4.4,  chloride 105, CO2 currently at 22,  glucose 88, BUN 11, creatinine 0.69.  CBC on Mar 24, 2009, white blood  cell count 4100, hemoglobin 5.3, hematocrit 18.  The MCV was recorded at  69.8, MCHC was 29.5, and platelet count was 105,000.  Stool for occult  blood was negative.  Patient had a urinalysis on Mar 25, 2009, yellow,  clear 1.02, pH 5.5, negative for glucose or bilirubin, and had trace  ketones.  CBC on Mar 25, 2009, hemoglobin 8.2, hematocrit 26.1.  Reticulocyte count on Mar 24, 2009, was 0.8.  Iron and iron-binding  capacity revealed serum iron 44, iron-binding capacity 348, percent  saturation 13.  Patient was typed and crossed for several units of  blood.  On Mar 26, 2009, BMET was unremarkable and on Mar 31, 2009, the  patient had a CBC, hemoglobin 8.9, hematocrit 27.9, and platelet count  221,000.   RADIOLOGIC STUDIES:  CT scan of the patient's brain to evaluate syncope  and rule out subdural hematoma revealed age-related cerebral atrophy,  ventriculomegaly, no acute intracranial findings or mass lesions,  chronic left maxillary sinusitis.  Chest  x-ray, heart was moderately  enlarged, no pneumothorax, cardiomegaly without pulmonary edema.   HOSPITAL COURSE:  This 77 year old black woman was admitted to the  hospital for further evaluation of syncope and also evaluation of her  anemia.  After admission, the baseline laboratory studies were obtained  and are included in the body of the patient's chart.  The first order of  business was to replace the patient's blood.  She was typed and crossed  and transfusion was actually started in the emergency room.  It was  continued when the patient reached the floor.  She was initially  admitted to the intensive care unit as she was quite hypotensive.  Blood  pressure recorded below 100 systolic.  In fact, the blood pressure  recording in the emergency room was nearing 90/60.   The patient had additional lab studies which included  iron, iron-binding  capacity, reticulocyte count and these were included in the body of the  chart as well.  Patient had a CAT scan of the brain to exclude the  possibility of subdural hematoma as she did give a history of several  falls.  She was also started on Zithromax on initial presentation and  given for 4 days for suspected bronchitis and also given albuterol 2.5  via handheld nebulizer 4 times a day.  Cardiovascular p.r.n. orders were  also ordered.   It was related that the patient also had a severe problem with drinking,  drinking up to 5 .beers per day, and the delirium tremens protocol was  started with Ativan.   She had continued problems with anemia and eventually had to be  transfused additional units of packed red cells.  Her stool guaiacs  remarkably were unremarkable and were not positive.   Consultation was requested by Gastroenterology.  The patient underwent  upper endoscopy and did reveal an erosion area in her stoma but was not  actively bleeding.  She had some ectasia and this was probably the  source of the patient's anemia according to Dr. Matthias Hughs.  No ulcer, no  cancer, or other evidence was seen.  Patient was initially to be set up  for colonoscopy but she was so confused and became combative and  colonoscopy was held at this time.   The patient continued to be somewhat confused throughout the remainder  of her hospital course but 1 or 2 days prior to discharge she became  more manageable when placed on Seroquel 12.5 mg by mouth twice a day.  She was transferred from the intensive care unit to telemetry where she  continued to have a few days of some mild confusion but eventually this  improved.  She was eventually started on iron and given p.o.  pantoprazole and her confusion began to improve and it was felt that the  patient had reached maximum hospital benefit.  Unfortunately, the  patient had some difficulty with ambulation and physical therapy was   requested and they felt that the patient may not be home alone and  suggested 24-hour monitoring.  At this time, arrangements were being  made for possible short stay in a rehab unit.  This was discussed with  the physical therapist.  It will also be discussed with the patient's  daughters who could not be contacted at the time of this dictation.  The  patient seemed stable at this time enough for some rehab in another  facility.   DISCHARGE MEDICATIONS:  Will include:  1. Therapeutic multivitamins with minerals  one daily.  2. Ferrous sulfate 325 mg p.o. t.i.d.  3. Ativan 0.5 mg p.o. b.i.d. as needed.  4. Seroquel 12.5 mg p.o. b.i.d.  5. Nicotine patch 21 mg to thorax daily.  6. Pantoprazole 40 mg by mouth q.a.m.   The patient's blood pressure medicines will not be restarted at this  time until her anemia stabilizes.   DISCHARGE DIET:  No added salt diet.   Patient will require physical therapy preferably at the rehab unit.  If  not, home physical therapy will be ordered.   CONDITION AT DISCHARGE:  Improved.   The patient will also likely need a colonoscopy at some point in the  very near future.  This may be obtained as an outpatient.      Osvaldo Shipper. Spruill, M.D.  Electronically Signed     JOS/MEDQ  D:  03/31/2009  T:  03/31/2009  Job:  [id number]

## 2011-08-11 LAB — URINALYSIS, ROUTINE W REFLEX MICROSCOPIC
Bilirubin Urine: NEGATIVE
Glucose, UA: NEGATIVE mg/dL
Hgb urine dipstick: NEGATIVE
Ketones, ur: NEGATIVE mg/dL
Specific Gravity, Urine: 1.003 — ABNORMAL LOW (ref 1.005–1.030)
pH: 5.5 (ref 5.0–8.0)

## 2011-09-08 ENCOUNTER — Ambulatory Visit (HOSPITAL_COMMUNITY)
Admission: RE | Admit: 2011-09-08 | Discharge: 2011-09-08 | Disposition: A | Discharge status: Home / Self Care | Payer: Medicare Other | Source: Ambulatory Visit | Attending: Cardiology | Admitting: Cardiology

## 2011-09-08 DIAGNOSIS — M7989 Other specified soft tissue disorders: Secondary | ICD-10-CM

## 2011-09-08 DIAGNOSIS — M79609 Pain in unspecified limb: Secondary | ICD-10-CM

## 2014-05-23 ENCOUNTER — Emergency Department (HOSPITAL_COMMUNITY)
Admission: EM | Admit: 2014-05-23 | Discharge: 2014-05-23 | Disposition: A | Discharge status: Home / Self Care | Payer: Medicare Other | Attending: Emergency Medicine | Admitting: Emergency Medicine

## 2014-05-23 ENCOUNTER — Encounter (HOSPITAL_COMMUNITY): Payer: Self-pay | Admitting: Emergency Medicine

## 2014-05-23 ENCOUNTER — Emergency Department (HOSPITAL_COMMUNITY): Payer: Medicare Other

## 2014-05-23 DIAGNOSIS — F0390 Unspecified dementia without behavioral disturbance: Secondary | ICD-10-CM | POA: Insufficient documentation

## 2014-05-23 DIAGNOSIS — I10 Essential (primary) hypertension: Secondary | ICD-10-CM | POA: Insufficient documentation

## 2014-05-23 DIAGNOSIS — I82402 Acute embolism and thrombosis of unspecified deep veins of left lower extremity: Secondary | ICD-10-CM

## 2014-05-23 DIAGNOSIS — F17200 Nicotine dependence, unspecified, uncomplicated: Secondary | ICD-10-CM | POA: Insufficient documentation

## 2014-05-23 DIAGNOSIS — I82409 Acute embolism and thrombosis of unspecified deep veins of unspecified lower extremity: Secondary | ICD-10-CM | POA: Insufficient documentation

## 2014-05-23 DIAGNOSIS — M79605 Pain in left leg: Secondary | ICD-10-CM

## 2014-05-23 DIAGNOSIS — M7989 Other specified soft tissue disorders: Secondary | ICD-10-CM

## 2014-05-23 DIAGNOSIS — R531 Weakness: Secondary | ICD-10-CM

## 2014-05-23 HISTORY — DX: Essential (primary) hypertension: I10

## 2014-05-23 HISTORY — DX: Unspecified dementia, unspecified severity, without behavioral disturbance, psychotic disturbance, mood disturbance, and anxiety: F03.90

## 2014-05-23 LAB — CBC WITH DIFFERENTIAL/PLATELET
Basophils Absolute: 0 10*3/uL (ref 0.0–0.1)
Basophils Relative: 0 % (ref 0–1)
Eosinophils Absolute: 0.2 10*3/uL (ref 0.0–0.7)
Eosinophils Relative: 3 % (ref 0–5)
HCT: 36.4 % (ref 36.0–46.0)
Hemoglobin: 11.4 g/dL — ABNORMAL LOW (ref 12.0–15.0)
LYMPHS ABS: 1.5 10*3/uL (ref 0.7–4.0)
LYMPHS PCT: 24 % (ref 12–46)
MCH: 26.1 pg (ref 26.0–34.0)
MCHC: 31.3 g/dL (ref 30.0–36.0)
MCV: 83.3 fL (ref 78.0–100.0)
Monocytes Absolute: 0.6 10*3/uL (ref 0.1–1.0)
Monocytes Relative: 9 % (ref 3–12)
NEUTROS PCT: 64 % (ref 43–77)
Neutro Abs: 4 10*3/uL (ref 1.7–7.7)
Platelets: 270 10*3/uL (ref 150–400)
RBC: 4.37 MIL/uL (ref 3.87–5.11)
RDW: 18.4 % — ABNORMAL HIGH (ref 11.5–15.5)
WBC: 6.3 10*3/uL (ref 4.0–10.5)

## 2014-05-23 LAB — BASIC METABOLIC PANEL
ANION GAP: 12 (ref 5–15)
BUN: 15 mg/dL (ref 6–23)
CALCIUM: 10.2 mg/dL (ref 8.4–10.5)
CO2: 31 mEq/L (ref 19–32)
Chloride: 103 mEq/L (ref 96–112)
Creatinine, Ser: 0.44 mg/dL — ABNORMAL LOW (ref 0.50–1.10)
GFR calc non Af Amer: 89 mL/min — ABNORMAL LOW (ref >90)
Glucose, Bld: 87 mg/dL (ref 70–99)
POTASSIUM: 3.4 meq/L — AB (ref 3.7–5.3)
SODIUM: 146 meq/L (ref 137–147)

## 2014-05-23 LAB — URINALYSIS, ROUTINE W REFLEX MICROSCOPIC
Bilirubin Urine: NEGATIVE
GLUCOSE, UA: NEGATIVE mg/dL
Hgb urine dipstick: NEGATIVE
Ketones, ur: 15 mg/dL — AB
LEUKOCYTES UA: NEGATIVE
Nitrite: NEGATIVE
PH: 7 (ref 5.0–8.0)
PROTEIN: NEGATIVE mg/dL
Specific Gravity, Urine: 1.017 (ref 1.005–1.030)
Urobilinogen, UA: 1 mg/dL (ref 0.0–1.0)

## 2014-05-23 LAB — PRO B NATRIURETIC PEPTIDE: Pro B Natriuretic peptide (BNP): 127.2 pg/mL (ref 0–450)

## 2014-05-23 MED ORDER — SODIUM CHLORIDE 0.9 % IV BOLUS (SEPSIS): 500.0000 mL | Freq: Once | INTRAVENOUS | Status: DC

## 2014-05-23 MED ORDER — HYDROCODONE-ACETAMINOPHEN 5-325 MG PO TABS: 1.0000 | ORAL_TABLET | Freq: Four times a day (QID) | ORAL | Status: DC | PRN

## 2014-05-23 MED ORDER — RIVAROXABAN 15 MG PO TABS: 15.0000 mg | ORAL_TABLET | Freq: Two times a day (BID) | ORAL | Status: DC

## 2014-05-23 MED ORDER — RIVAROXABAN 15 MG PO TABS
15.0000 mg | ORAL_TABLET | Freq: Once | ORAL | Status: AC
  Administered 2014-05-23: 15 mg via ORAL
  Filled 2014-05-23: qty 1

## 2014-05-23 MED ORDER — ACETAMINOPHEN 325 MG PO TABS
650.0000 mg | ORAL_TABLET | Freq: Once | ORAL | Status: AC
  Administered 2014-05-23: 650 mg via ORAL
  Filled 2014-05-23: qty 2

## 2014-05-23 NOTE — Discharge Instructions (Signed)
See her Dr. on Monday or Tuesday to discuss the blood clot and further treatment and recheck. Take Tylenol and use ice for pain. Return for chest pain, shortness of breath, fevers or if he hit her head or have bleeding in your stools.  If you were given medicines take as directed.  If you are on coumadin or contraceptives realize their levels and effectiveness is altered by many different medicines.  If you have any reaction (rash, tongues swelling, other) to the medicines stop taking and see a physician.   Please follow up as directed and return to the ER or see a physician for new or worsening symptoms.  Thank you. Filed Vitals:   05/23/14 0911 05/23/14 0912 05/23/14 1151  BP:  155/105 182/94  Pulse:  93 89  Temp:  98.5 F (36.9 C) 98.1 F (36.7 C)  TempSrc:  Oral Oral  Resp:  16 16  SpO2: 93% 95% 96%

## 2014-05-23 NOTE — ED Notes (Signed)
Per EMS pt coming from home with c/o left leg swelling and pain x 1 year. Per EMS pt is nonambulatory, VSS

## 2014-05-23 NOTE — ED Notes (Signed)
Per Jodi MourningZavitz, I/O may be performed since pt is Incontinent.

## 2014-05-23 NOTE — ED Notes (Signed)
[facility] called for transport.  

## 2014-05-23 NOTE — ED Notes (Signed)
Bed: ZO10WA14 Expected date:  Expected time:  Means of arrival:  Comments: EMS l.l.e. Pain/swelling

## 2014-05-23 NOTE — ED Provider Notes (Signed)
CSN: [id number]     Arrival date & time 05/23/14  0910 History   First MD Initiated Contact with Patient 05/23/14 0913     Chief Complaint  Patient presents with  . Leg Swelling     (Consider location/radiation/quality/duration/timing/severity/associated sxs/prior Treatment) HPI Comments: 80 year old female current smoker, high blood pressure, dementia presents from home with leg swelling, left hip and thigh pain and general weakness. Patient has been depressed and generally weak and bedbound for the past year since her husband died. Family's been taking excellent care of for around-the-clock and had extreme difficulty getting her out of bed even to followup with her doctor. They were able to convince her to come and see a physician to check her medications and for the hip pain. Patient has not fallen and no fevers or known recent infectious symptoms. Patient has had leg swelling for 6 months worse on the left recently. No known blood clot history. Her leg pain is worse with movement and nothing has improved her general weakness and depression symptoms. Most the history is from family, patient denies most questions except for pain in the left thigh and leg.  The history is provided by the patient.    Past Medical History  Diagnosis Date  . Hypertension   . Dementia    History reviewed. No pertinent past surgical history. No family history on file. History  Substance Use Topics  . Smoking status: Current Every Day Smoker  . Smokeless tobacco: Not on file  . Alcohol Use: No   OB History   Grav Para Term Preterm Abortions TAB SAB Ect Mult Living                 Review of Systems  Constitutional: Negative for fever and chills.  HENT: Negative for congestion.   Eyes: Negative for visual disturbance.  Respiratory: Negative for shortness of breath.   Cardiovascular: Negative for chest pain.  Gastrointestinal: Negative for vomiting and abdominal pain.  Genitourinary: Negative for  dysuria and flank pain.  Musculoskeletal: Positive for arthralgias and gait problem. Negative for back pain, neck pain and neck stiffness.  Skin: Negative for rash.  Neurological: Negative for light-headedness and headaches.      Allergies  Review of patient's allergies indicates no known allergies.  Home Medications   Prior to Admission medications   Medication Sig Start Date End Date Taking? Authorizing Provider  donepezil (ARICEPT) 5 MG tablet  05/12/14   Historical Provider, MD  valsartan-hydrochlorothiazide (DIOVAN-HCT) 320-12.5 MG per tablet  05/11/14   Historical Provider, MD   BP 155/105  Pulse 93  Temp(Src) 98.5 F (36.9 C) (Oral)  Resp 16  SpO2 95% Physical Exam  Nursing note and vitals reviewed. Constitutional: She appears well-developed and well-nourished.  HENT:  Head: Normocephalic and atraumatic.  Mild dry mucous membranes  Eyes: Right eye exhibits no discharge. Left eye exhibits no discharge.  Neck: Normal range of motion. Neck supple. No tracheal deviation present.  Cardiovascular: Normal rate and regular rhythm.   Pulmonary/Chest: Effort normal. She has rales.  Focal rales right lung base no distress  Abdominal: Soft. She exhibits no distension. There is no tenderness. There is no guarding.  Musculoskeletal: She exhibits tenderness. She exhibits no edema.  Patient has tenderness left proximal femur and anterior hip without significant sign of infection.  Neurological: She is alert.  Pleasant dementia, patient follows commands, and normal speech patient alert and oriented that she is a medical setting, pupils equal bilateral, moves extremities equal bilateral  with 3 to 4+ strength bilateral. Gross sensation intact bilateral  Skin: Skin is warm. No rash noted.  Patient has a few superficial skin abrasions without significant skin ulcer visualized.  Psychiatric:  Pleasant dementia    ED Course  Procedures (including critical care time) Labs Review Labs  Reviewed  BASIC METABOLIC PANEL - Abnormal; Notable for the following:    Potassium 3.4 (*)    Creatinine, Ser 0.44 (*)    GFR calc non Af Amer 89 (*)    All other components within normal limits  CBC WITH DIFFERENTIAL - Abnormal; Notable for the following:    Hemoglobin 11.4 (*)    RDW 18.4 (*)    All other components within normal limits  URINALYSIS, ROUTINE W REFLEX MICROSCOPIC - Abnormal; Notable for the following:    Ketones, ur 15 (*)    All other components within normal limits  PRO B NATRIURETIC PEPTIDE    Imaging Review Dg Chest 2 View  05/23/2014   CLINICAL DATA:  Left lower extremity edema.  EXAM: CHEST - 2 VIEW  COMPARISON:  12/19/2010  FINDINGS: Lung volumes are low bilaterally. Stable bilateral pulmonary scarring. There is no evidence of pulmonary edema, consolidation, pneumothorax, nodule or pleural fluid. The heart size is stable. There is stable tortuosity of the thoracic aorta. Visualized bony structures show stable osteopenia and mild degenerative changes of the thoracic spine without visible fracture.  IMPRESSION: No active disease.  Stable pulmonary scarring.   Electronically Signed   By: Irish Lack M.D.   On: 05/23/2014 10:00   Dg Pelvis 1-2 Views  05/23/2014   CLINICAL DATA:  Hypertension.  Leg swelling.  EXAM: PELVIS - 1-2 VIEW  COMPARISON:  None.  FINDINGS: No fracture. No dislocation. No significant arthropathic changes. The bones are extensively demineralized. Vascular calcifications are noted along the aorta and iliac arteries. Soft tissues are otherwise unremarkable. Mid and lower sacrum is not well visualized on this exam due to overlying bowel gas and stool and bone demineralization.  IMPRESSION: No fracture or bone lesion.  No acute finding.   Electronically Signed   By: Amie Portland M.D.   On: 05/23/2014 10:00   Dg Femur Left  05/23/2014   CLINICAL DATA:  Left leg pain.  EXAM: LEFT FEMUR - 2 VIEW  COMPARISON:  None.  FINDINGS: No fracture. No  dislocation. No bone lesion. Bones are diffusely demineralized. There are vascular calcifications along the medial thigh. There is subcutaneous soft tissue edema laterally most evident lateral to the knee. This is nonspecific.  IMPRESSION: 1. No fracture or dislocation. No bone lesion. Extensive bone demineralization.   Electronically Signed   By: Amie Portland M.D.   On: 05/23/2014 10:01     EKG Interpretation None      MDM   Final diagnoses:  Left leg DVT  General weakness  Left leg pain   Patient's was then bedbound for over a year presents with general weakness, left hip pain and swelling. Since is very difficult to get patient out of the house and she is generally weak screening blood work will be done, urinalysis, ultrasound to look for blood clots in her legs, x-ray look for signs of fracture dislocation explain her pain. Tylenol for pain to start. Discussed this with the family.  Patient has left calf DVT which is likely from immobilization for the past year. Patient has no shortness breath or chest pain. Chest x-ray reviewed no acute findings, left hip x-ray reviewed no acute fracture.  Patient improved on recheck and I discussed close followup with primary Dr. Carlena HurlXarelto given for blood thinner.  Results and differential diagnosis were discussed with the patient/parent/guardian. Close follow up outpatient was discussed, comfortable with the plan.   Medications  acetaminophen (TYLENOL) tablet 650 mg (650 mg Oral Given 05/23/14 1047)  Rivaroxaban (XARELTO) tablet 15 mg (15 mg Oral Given 05/23/14 1242)    Filed Vitals:   05/23/14 0911 05/23/14 0912 05/23/14 1151  BP:  155/105 182/94  Pulse:  93 89  Temp:  98.5 F (36.9 C) 98.1 F (36.7 C)  TempSrc:  Oral Oral  Resp:  16 16  SpO2: 93% 95% 96%        Enid SkeensJoshua M Sally Menard, MD 05/23/14 1258

## 2014-05-23 NOTE — Progress Notes (Signed)
*  Preliminary Results* Bilateral lower extremity venous duplex completed. The visualized veins of the right lower extremity are negative for deep vein thrombosis. The left lower extremity exhibits deep vein thrombosis involving the left posterior tibial veins. There is no evidence of Baker's cyst bilaterally.  05/23/2014  Gertie FeyMichelle Jerral Mccauley, RVT, RDCS, RDMS

## 2014-05-23 NOTE — ED Notes (Signed)
Pt family member provided pt with Chips. Notified family member that pt was NPO.

## 2014-06-07 ENCOUNTER — Emergency Department (HOSPITAL_COMMUNITY): Payer: Medicare Other

## 2014-06-07 ENCOUNTER — Encounter (HOSPITAL_COMMUNITY): Payer: Self-pay | Admitting: Emergency Medicine

## 2014-06-07 ENCOUNTER — Inpatient Hospital Stay (HOSPITAL_COMMUNITY)
Admission: EM | Admit: 2014-06-07 | Discharge: 2014-06-09 | DRG: 176 | Disposition: A | Discharge status: Home Health | Payer: Medicare Other | Attending: Internal Medicine | Admitting: Internal Medicine

## 2014-06-07 DIAGNOSIS — I2699 Other pulmonary embolism without acute cor pulmonale: Secondary | ICD-10-CM

## 2014-06-07 DIAGNOSIS — D649 Anemia, unspecified: Secondary | ICD-10-CM | POA: Diagnosis present

## 2014-06-07 DIAGNOSIS — R000 Tachycardia, unspecified: Secondary | ICD-10-CM | POA: Diagnosis present

## 2014-06-07 DIAGNOSIS — R0902 Hypoxemia: Secondary | ICD-10-CM

## 2014-06-07 DIAGNOSIS — E876 Hypokalemia: Secondary | ICD-10-CM | POA: Diagnosis present

## 2014-06-07 DIAGNOSIS — I10 Essential (primary) hypertension: Secondary | ICD-10-CM

## 2014-06-07 DIAGNOSIS — F0390 Unspecified dementia without behavioral disturbance: Secondary | ICD-10-CM | POA: Diagnosis present

## 2014-06-07 DIAGNOSIS — I82409 Acute embolism and thrombosis of unspecified deep veins of unspecified lower extremity: Secondary | ICD-10-CM | POA: Diagnosis present

## 2014-06-07 DIAGNOSIS — F17200 Nicotine dependence, unspecified, uncomplicated: Secondary | ICD-10-CM | POA: Diagnosis present

## 2014-06-07 DIAGNOSIS — Z79899 Other long term (current) drug therapy: Secondary | ICD-10-CM | POA: Diagnosis not present

## 2014-06-07 DIAGNOSIS — Z7401 Bed confinement status: Secondary | ICD-10-CM

## 2014-06-07 DIAGNOSIS — I82402 Acute embolism and thrombosis of unspecified deep veins of left lower extremity: Secondary | ICD-10-CM

## 2014-06-07 DIAGNOSIS — R0602 Shortness of breath: Secondary | ICD-10-CM | POA: Diagnosis not present

## 2014-06-07 LAB — URINALYSIS, ROUTINE W REFLEX MICROSCOPIC
BILIRUBIN URINE: NEGATIVE
GLUCOSE, UA: NEGATIVE mg/dL
HGB URINE DIPSTICK: NEGATIVE
KETONES UR: NEGATIVE mg/dL
Leukocytes, UA: NEGATIVE
NITRITE: NEGATIVE
PH: 6 (ref 5.0–8.0)
Protein, ur: NEGATIVE mg/dL
SPECIFIC GRAVITY, URINE: 1.023 (ref 1.005–1.030)
Urobilinogen, UA: 1 mg/dL (ref 0.0–1.0)

## 2014-06-07 LAB — COMPREHENSIVE METABOLIC PANEL
ALBUMIN: 3 g/dL — AB (ref 3.5–5.2)
ALT: 7 U/L (ref 0–35)
AST: 17 U/L (ref 0–37)
Alkaline Phosphatase: 95 U/L (ref 39–117)
Anion gap: 9 (ref 5–15)
BILIRUBIN TOTAL: 0.2 mg/dL — AB (ref 0.3–1.2)
BUN: 9 mg/dL (ref 6–23)
CO2: 37 mEq/L — ABNORMAL HIGH (ref 19–32)
Calcium: 9.6 mg/dL (ref 8.4–10.5)
Chloride: 99 mEq/L (ref 96–112)
Creatinine, Ser: 0.46 mg/dL — ABNORMAL LOW (ref 0.50–1.10)
GFR calc Af Amer: >90 mL/min (ref >90)
GFR calc non Af Amer: 88 mL/min — ABNORMAL LOW (ref >90)
Glucose, Bld: 108 mg/dL — ABNORMAL HIGH (ref 70–99)
Potassium: 3.1 mEq/L — ABNORMAL LOW (ref 3.7–5.3)
SODIUM: 145 meq/L (ref 137–147)
TOTAL PROTEIN: 6.9 g/dL (ref 6.0–8.3)

## 2014-06-07 LAB — I-STAT CHEM 8, ED
BUN: 7 mg/dL (ref 6–23)
CHLORIDE: 97 meq/L (ref 96–112)
Calcium, Ion: 1.17 mmol/L (ref 1.13–1.30)
Creatinine, Ser: 0.6 mg/dL (ref 0.50–1.10)
GLUCOSE: 103 mg/dL — AB (ref 70–99)
HEMATOCRIT: 36 % (ref 36.0–46.0)
Hemoglobin: 12.2 g/dL (ref 12.0–15.0)
POTASSIUM: 3 meq/L — AB (ref 3.7–5.3)
Sodium: 142 mEq/L (ref 137–147)
TCO2: 35 mmol/L (ref 0–100)

## 2014-06-07 LAB — APTT: aPTT: 35 seconds (ref 24–37)

## 2014-06-07 LAB — CBC WITH DIFFERENTIAL/PLATELET
BASOS PCT: 1 % (ref 0–1)
Basophils Absolute: 0 10*3/uL (ref 0.0–0.1)
EOS ABS: 0.3 10*3/uL (ref 0.0–0.7)
Eosinophils Relative: 4 % (ref 0–5)
HCT: 33.7 % — ABNORMAL LOW (ref 36.0–46.0)
Hemoglobin: 10.3 g/dL — ABNORMAL LOW (ref 12.0–15.0)
Lymphocytes Relative: 28 % (ref 12–46)
Lymphs Abs: 1.8 10*3/uL (ref 0.7–4.0)
MCH: 25.7 pg — AB (ref 26.0–34.0)
MCHC: 30.6 g/dL (ref 30.0–36.0)
MCV: 84 fL (ref 78.0–100.0)
Monocytes Absolute: 0.7 10*3/uL (ref 0.1–1.0)
Monocytes Relative: 11 % (ref 3–12)
NEUTROS PCT: 56 % (ref 43–77)
Neutro Abs: 3.5 10*3/uL (ref 1.7–7.7)
Platelets: 322 10*3/uL (ref 150–400)
RBC: 4.01 MIL/uL (ref 3.87–5.11)
RDW: 17.9 % — ABNORMAL HIGH (ref 11.5–15.5)
WBC: 6.3 10*3/uL (ref 4.0–10.5)

## 2014-06-07 LAB — PROTIME-INR
INR: 0.99 (ref 0.00–1.49)
Prothrombin Time: 13.1 seconds (ref 11.6–15.2)

## 2014-06-07 MED ORDER — HYDROCHLOROTHIAZIDE 12.5 MG PO CAPS
12.5000 mg | ORAL_CAPSULE | Freq: Every day | ORAL | Status: DC
  Administered 2014-06-08 – 2014-06-09 (×2): 12.5 mg via ORAL
  Filled 2014-06-07 (×2): qty 1

## 2014-06-07 MED ORDER — VALSARTAN-HYDROCHLOROTHIAZIDE 320-12.5 MG PO TABS: 1.0000 | ORAL_TABLET | Freq: Every day | ORAL | Status: DC

## 2014-06-07 MED ORDER — ACETAMINOPHEN 650 MG RE SUPP: 650.0000 mg | Freq: Four times a day (QID) | RECTAL | Status: DC | PRN

## 2014-06-07 MED ORDER — PNEUMOCOCCAL VAC POLYVALENT 25 MCG/0.5ML IJ INJ
0.5000 mL | INJECTION | INTRAMUSCULAR | Status: AC
  Administered 2014-06-08: 0.5 mL via INTRAMUSCULAR
  Filled 2014-06-07 (×2): qty 0.5

## 2014-06-07 MED ORDER — HYDROCODONE-ACETAMINOPHEN 5-325 MG PO TABS
1.0000 | ORAL_TABLET | Freq: Four times a day (QID) | ORAL | Status: DC | PRN
  Administered 2014-06-08 – 2014-06-09 (×3): 1 via ORAL
  Filled 2014-06-07 (×3): qty 1

## 2014-06-07 MED ORDER — POTASSIUM CHLORIDE 10 MEQ/100ML IV SOLN: 10.0000 meq | Freq: Once | INTRAVENOUS | Status: DC

## 2014-06-07 MED ORDER — POTASSIUM CHLORIDE 20 MEQ/15ML (10%) PO LIQD
40.0000 meq | Freq: Once | ORAL | Status: DC
Start: 2014-06-07 — End: 2014-06-08

## 2014-06-07 MED ORDER — SODIUM CHLORIDE 0.9 % IJ SOLN
3.0000 mL | Freq: Two times a day (BID) | INTRAMUSCULAR | Status: DC
  Administered 2014-06-08: 3 mL via INTRAVENOUS

## 2014-06-07 MED ORDER — ONDANSETRON HCL 4 MG/2ML IJ SOLN
4.0000 mg | Freq: Four times a day (QID) | INTRAMUSCULAR | Status: DC | PRN
Start: 2014-06-07 — End: 2014-06-09
  Administered 2014-06-08: 4 mg via INTRAVENOUS
  Filled 2014-06-07: qty 2

## 2014-06-07 MED ORDER — ONDANSETRON HCL 4 MG PO TABS: 4.0000 mg | ORAL_TABLET | Freq: Four times a day (QID) | ORAL | Status: DC | PRN

## 2014-06-07 MED ORDER — HEPARIN BOLUS VIA INFUSION
4000.0000 [IU] | Freq: Once | INTRAVENOUS | Status: AC
  Administered 2014-06-07: 4000 [IU] via INTRAVENOUS
  Filled 2014-06-07: qty 4000

## 2014-06-07 MED ORDER — IRBESARTAN 300 MG PO TABS
300.0000 mg | ORAL_TABLET | Freq: Every day | ORAL | Status: DC
  Administered 2014-06-08 – 2014-06-09 (×2): 300 mg via ORAL
  Filled 2014-06-07 (×2): qty 1

## 2014-06-07 MED ORDER — IOHEXOL 350 MG/ML SOLN
100.0000 mL | Freq: Once | INTRAVENOUS | Status: AC | PRN
  Administered 2014-06-07: 100 mL via INTRAVENOUS

## 2014-06-07 MED ORDER — HEPARIN (PORCINE) IN NACL 100-0.45 UNIT/ML-% IJ SOLN
900.0000 [IU]/h | INTRAMUSCULAR | Status: DC
  Administered 2014-06-07: 1000 [IU]/h via INTRAVENOUS
  Administered 2014-06-08: 900 [IU]/h via INTRAVENOUS
  Filled 2014-06-07 (×3): qty 250

## 2014-06-07 MED ORDER — NICOTINE 14 MG/24HR TD PT24
14.0000 mg | MEDICATED_PATCH | Freq: Every day | TRANSDERMAL | Status: DC
  Administered 2014-06-08 – 2014-06-09 (×2): 14 mg via TRANSDERMAL
  Filled 2014-06-07 (×3): qty 1

## 2014-06-07 MED ORDER — DONEPEZIL HCL 5 MG PO TABS
5.0000 mg | ORAL_TABLET | Freq: Every morning | ORAL | Status: DC
  Administered 2014-06-08 – 2014-06-09 (×2): 5 mg via ORAL
  Filled 2014-06-07 (×2): qty 1

## 2014-06-07 MED ORDER — ACETAMINOPHEN 325 MG PO TABS: 650.0000 mg | ORAL_TABLET | Freq: Four times a day (QID) | ORAL | Status: DC | PRN

## 2014-06-07 MED ORDER — MORPHINE SULFATE 2 MG/ML IJ SOLN
2.0000 mg | INTRAMUSCULAR | Status: DC | PRN
  Administered 2014-06-07: 2 mg via INTRAVENOUS
  Filled 2014-06-07: qty 1

## 2014-06-07 MED ORDER — LORAZEPAM 2 MG/ML IJ SOLN
0.5000 mg | Freq: Once | INTRAMUSCULAR | Status: AC
  Administered 2014-06-07: 0.5 mg via INTRAVENOUS
  Filled 2014-06-07: qty 1

## 2014-06-07 NOTE — ED Notes (Signed)
Patient family brought her in due to her breathing pattern and wanted to make sure the blood clot in her leg has not traveled anywhere else.

## 2014-06-07 NOTE — ED Notes (Signed)
Patient is nauseated but has not vomited. Every where patient is touched she stated she hurts. Patient has a cough.

## 2014-06-07 NOTE — ED Provider Notes (Signed)
Medical screening examination/treatment/procedure(s) were performed by non-physician practitioner and as supervising physician I was immediately available for consultation/collaboration.   EKG Interpretation None        Ann ChurnJohn David Keshaun Dubey III, MD 06/07/14 2150

## 2014-06-07 NOTE — ED Notes (Signed)
Patient had a bowel movement and was cleaned up.

## 2014-06-07 NOTE — ED Notes (Signed)
Bed: ZO10WA25 Expected date:  Expected time:  Means of arrival:  Comments: ems- 80 yo, pain all over, nausea, slightly SOB

## 2014-06-07 NOTE — Progress Notes (Signed)
ANTICOAGULATION CONSULT NOTE - Initial Consult  Pharmacy Consult for IV Heparin Indication: Recent DVT (7/18) / new SOB/CP  No Known Allergies  Patient Measurements: Height: 5\' 3"  (160 cm) Weight: 130 lb (58.968 kg) IBW/kg (Calculated) : 52.4   Vital Signs: Temp: 98.5 F (36.9 C) (08/02 1352) Temp src: Oral (08/02 1352) BP: 149/81 mmHg (08/02 1352) Pulse Rate: 100 (08/02 1352)  Labs: No results found for this basename: HGB, HCT, PLT, APTT, LABPROT, INR, HEPARINUNFRC, CREATININE, CKTOTAL, CKMB, TROPONINI,  in the last 72 hours  Estimated Creatinine Clearance: 41.8 ml/min (by C-G formula based on Cr of 0.44).   Medical History: Past Medical History  Diagnosis Date  . Hypertension   . Dementia     Medications:  Scheduled:  Infusions:   Assessment: 80 yo with recent diagnosis of LLE DVT. Pt never picked up Rx for Xarelto.   Goal of Therapy:  Heparin level 0.3-0.7 units/ml Monitor platelets by anticoagulation protocol: Yes   Plan:   Baseline coags/ht/wt stat  Heparin 4000 unit bolus x1  Start drip @ 1000 units/hr  Daily CBC/HL  Check 1st HL in 8 hours  Lorenza EvangelistGreen, Christi Wirick R 06/07/2014,2:33 PM

## 2014-06-07 NOTE — ED Provider Notes (Signed)
CSN: [id number]     Arrival date & time 06/07/14  1347 History   First MD Initiated Contact with Patient 06/07/14 1358     Chief Complaint  Patient presents with  . Pain    every thing hurts on patient.     (Consider location/radiation/quality/duration/timing/severity/associated sxs/prior Treatment) The history is provided by the patient.    Bedbound pt with hx dementia, HTN, smoking, recently diagnosed with a blood clot, brought in by family for increased work of breathing, cough, occasional chest pain that began this morning.  Continues to have pain and swelling in her left leg and is now having pain in her right leg. Family member reports they were unable to pay $400 for the xarelto and therefore have just been giving her aspirin to treat the blood clot, have also been unable to follow up with the patient's family doctor.  Daughters deny fevers, abdominal pain, change in urination or stool, N/V.   Past Medical History  Diagnosis Date  . Hypertension   . Dementia    No past surgical history on file. No family history on file. History  Substance Use Topics  . Smoking status: Current Every Day Smoker  . Smokeless tobacco: Not on file  . Alcohol Use: No   OB History   Grav Para Term Preterm Abortions TAB SAB Ect Mult Living                 Review of Systems  All other systems reviewed and are negative.     Allergies  Review of patient's allergies indicates no known allergies.  Home Medications   Prior to Admission medications   Medication Sig Start Date End Date Taking? Authorizing Provider  donepezil (ARICEPT) 5 MG tablet Take 5 mg by mouth every morning.  05/12/14  Yes Historical Provider, MD  HYDROcodone-acetaminophen (NORCO) 5-325 MG per tablet Take 1 tablet by mouth every 6 (six) hours as needed. 05/23/14  Yes Enid Skeens, MD  Multiple Vitamin (MULTIVITAMIN WITH MINERALS) TABS tablet Take 1 tablet by mouth daily.   Yes Historical Provider, MD   valsartan-hydrochlorothiazide (DIOVAN-HCT) 320-12.5 MG per tablet Take 1 tablet by mouth daily.  05/11/14  Yes Historical Provider, MD  Rivaroxaban (XARELTO) 15 MG TABS tablet Take 1 tablet (15 mg total) by mouth 2 (two) times daily. 05/23/14   Enid Skeens, MD   BP 149/81  Pulse 100  Temp(Src) 98.5 F (36.9 C) (Oral)  Resp 20  SpO2 98% Physical Exam  Nursing note and vitals reviewed. Constitutional: She appears well-developed and well-nourished. No distress.  HENT:  Head: Normocephalic and atraumatic.  Neck: Neck supple.  Cardiovascular: Normal rate and regular rhythm.   Pulmonary/Chest: Tachypnea noted. No respiratory distress. She has no wheezes. She has rhonchi. She has no rales.  Abdominal: Soft. She exhibits no distension. There is generalized tenderness. There is no rebound and no guarding.  Musculoskeletal:  Left lower extremity edema.  Bilateral calf tenderness.  Distal pulses intact bilaterally.   Neurological: She is alert.  Skin: She is not diaphoretic.    ED Course  Procedures (including critical care time) Labs Review Labs Reviewed  CBC WITH DIFFERENTIAL - Abnormal; Notable for the following:    Hemoglobin 10.3 (*)    HCT 33.7 (*)    MCH 25.7 (*)    RDW 17.9 (*)    All other components within normal limits  COMPREHENSIVE METABOLIC PANEL - Abnormal; Notable for the following:    Potassium 3.1 (*)  CO2 37 (*)    Glucose, Bld 108 (*)    Creatinine, Ser 0.46 (*)    Albumin 3.0 (*)    Total Bilirubin 0.2 (*)    GFR calc non Af Amer 88 (*)    All other components within normal limits  I-STAT CHEM 8, ED - Abnormal; Notable for the following:    Potassium 3.0 (*)    Glucose, Bld 103 (*)    All other components within normal limits  URINALYSIS, ROUTINE W REFLEX MICROSCOPIC  APTT  PROTIME-INR  HEPARIN LEVEL (UNFRACTIONATED)    Imaging Review Dg Chest Port 1 View  06/07/2014   CLINICAL DATA:  Short of breath  EXAM: PORTABLE CHEST - 1 VIEW  COMPARISON:   05/23/2014  FINDINGS: Mild cardiomegaly. Left upper lobe spiculated density. Right lung is clear. No pneumothorax or pleural effusion  IMPRESSION: Left upper lobe spiculated density. Upright PA and lateral chest radiographs are recommended when feasible.   Electronically Signed   By: Maryclare BeanArt  Hoss M.D.   On: 06/07/2014 14:47     EKG Interpretation None       Date: 06/07/2014  Rate: 93  Rhythm: normal sinus rhythm, PACs  QRS Axis: normal  Intervals: normal  ST/T Wave abnormalities: nonspecific T wave changes  Conduction Disutrbances:none  Narrative Interpretation:   Old EKG Reviewed: none available    2:19 PM Discussed pt with Dr Micheline Mazeocherty.  Will start heparin per pharmacy consult.   Filed Vitals:   06/07/14 1352  BP: 149/81  Pulse: 100  Temp: 98.5 F (36.9 C)  Resp: 20     MDM   Final diagnoses:  None    Pt with dementia, bed bound living at home under the care of family members, recently diagnosed with left lower extremity DVT.  Family unable to afford the xarelto and have been treating with daily aspirin.  Pt began having increased work of breathing today and c/o SOB and occasional chest pain.  Unclear what O2 was on room air when picked up by EMS - pt is 96% on 2L by  in ED.  CXR shows spiculated density that will be clarified by CT - pt is a smoker.   Labs show anemia, hypokalemia.   Discussed pt with Tyler DeisJen Piepenbrink, PA-C, who assumes care of patient at change of shift pending CT angio chest.  Anticipate admission to the hospital.       Trixie DredgeEmily Lexton Hidalgo, PA-C 06/07/14 1533

## 2014-06-07 NOTE — ED Provider Notes (Signed)
[facility] [place] pending CT angio chest.   Medications  heparin bolus via infusion 4,000 Units (4,000 Units Intravenous New Bag/Given 06/07/14 1547)    Followed by  heparin ADULT infusion 100 units/mL (25000 units/250 mL) (1,000 Units/hr Intravenous New Bag/Given 06/07/14 1547)  potassium chloride 20 MEQ/15ML (10%) liquid 40 mEq (not administered)  potassium chloride 10 mEq in 100 mL IVPB (not administered)  iohexol (OMNIPAQUE) 350 MG/ML injection 100 mL (100 mLs Intravenous Contrast Given 06/07/14 1550)   Results for orders placed during the hospital encounter of 06/07/14  CBC WITH DIFFERENTIAL      Result Value Ref Range   WBC 6.3  4.0 - 10.5 K/uL   RBC 4.01  3.87 - 5.11 MIL/uL   Hemoglobin 10.3 (*) 12.0 - 15.0 g/dL   HCT 72.533.7 (*) 36.636.0 - 44.046.0 %   MCV 84.0  78.0 - 100.0 fL   MCH 25.7 (*) 26.0 - 34.0 pg   MCHC 30.6  30.0 - 36.0 g/dL   RDW 34.717.9 (*) 42.511.5 - 95.615.5 %   Platelets 322  150 - 400 K/uL   Neutrophils Relative % 56  43 - 77 %   Neutro Abs 3.5  1.7 - 7.7 K/uL   Lymphocytes Relative 28  12 - 46 %   Lymphs Abs 1.8  0.7 - 4.0 K/uL   Monocytes Relative 11  3 - 12 %   Monocytes Absolute 0.7  0.1 - 1.0 K/uL   Eosinophils Relative 4  0 - 5 %   Eosinophils Absolute 0.3  0.0 - 0.7 K/uL   Basophils Relative 1  0 - 1 %   Basophils Absolute 0.0  0.0 - 0.1 K/uL  COMPREHENSIVE METABOLIC PANEL      Result Value Ref Range   Sodium 145  137 - 147 mEq/L   Potassium 3.1 (*) 3.7 - 5.3 mEq/L   Chloride 99  96 - 112 mEq/L   CO2 37 (*) 19 - 32 mEq/L   Glucose, Bld 108 (*) 70 - 99 mg/dL   BUN 9  6 - 23 mg/dL   Creatinine, Ser 3.870.46 (*) 0.50 - 1.10 mg/dL   Calcium 9.6  8.4 - 56.410.5 mg/dL   Total Protein 6.9  6.0 - 8.3 g/dL   Albumin 3.0 (*) 3.5 - 5.2 g/dL   AST 17  0 - 37 U/L   ALT 7  0 - 35 U/L   Alkaline Phosphatase 95  39 - 117 U/L   Total Bilirubin 0.2 (*) 0.3 - 1.2 mg/dL   GFR calc non Af Amer 88 (*) >90 mL/min   GFR calc Af Amer >90  >90 mL/min   Anion gap 9  5 -  15  URINALYSIS, ROUTINE W REFLEX MICROSCOPIC      Result Value Ref Range   Color, Urine YELLOW  YELLOW   APPearance CLEAR  CLEAR   Specific Gravity, Urine 1.023  1.005 - 1.030   pH 6.0  5.0 - 8.0   Glucose, UA NEGATIVE  NEGATIVE mg/dL   Hgb urine dipstick NEGATIVE  NEGATIVE   Bilirubin Urine NEGATIVE  NEGATIVE   Ketones, ur NEGATIVE  NEGATIVE mg/dL   Protein, ur NEGATIVE  NEGATIVE mg/dL   Urobilinogen, UA 1.0  0.0 - 1.0 mg/dL   Nitrite NEGATIVE  NEGATIVE   Leukocytes, UA NEGATIVE  NEGATIVE  APTT      Result Value Ref Range   aPTT 35  24 - 37 seconds  PROTIME-INR  Result Value Ref Range   Prothrombin Time 13.1  11.6 - 15.2 seconds   INR 0.99  0.00 - 1.49  I-STAT CHEM 8, ED      Result Value Ref Range   Sodium 142  137 - 147 mEq/L   Potassium 3.0 (*) 3.7 - 5.3 mEq/L   Chloride 97  96 - 112 mEq/L   BUN 7  6 - 23 mg/dL   Creatinine, Ser 7.82  0.50 - 1.10 mg/dL   Glucose, Bld 956 (*) 70 - 99 mg/dL   Calcium, Ion 2.13  0.86 - 1.30 mmol/L   TCO2 35  0 - 100 mmol/L   Hemoglobin 12.2  12.0 - 15.0 g/dL   HCT 57.8  46.9 - 62.9 %   Dg Chest 2 View  05/23/2014   CLINICAL DATA:  Left lower extremity edema.  EXAM: CHEST - 2 VIEW  COMPARISON:  12/19/2010  FINDINGS: Lung volumes are low bilaterally. Stable bilateral pulmonary scarring. There is no evidence of pulmonary edema, consolidation, pneumothorax, nodule or pleural fluid. The heart size is stable. There is stable tortuosity of the thoracic aorta. Visualized bony structures show stable osteopenia and mild degenerative changes of the thoracic spine without visible fracture.  IMPRESSION: No active disease.  Stable pulmonary scarring.   Electronically Signed   By: Irish Lack M.D.   On: 05/23/2014 10:00   Dg Pelvis 1-2 Views  05/23/2014   CLINICAL DATA:  Hypertension.  Leg swelling.  EXAM: PELVIS - 1-2 VIEW  COMPARISON:  None.  FINDINGS: No fracture. No dislocation. No significant arthropathic changes. The bones are extensively  demineralized. Vascular calcifications are noted along the aorta and iliac arteries. Soft tissues are otherwise unremarkable. Mid and lower sacrum is not well visualized on this exam due to overlying bowel gas and stool and bone demineralization.  IMPRESSION: No fracture or bone lesion.  No acute finding.   Electronically Signed   By: Amie Portland M.D.   On: 05/23/2014 10:00   Dg Femur Left  05/23/2014   CLINICAL DATA:  Left leg pain.  EXAM: LEFT FEMUR - 2 VIEW  COMPARISON:  None.  FINDINGS: No fracture. No dislocation. No bone lesion. Bones are diffusely demineralized. There are vascular calcifications along the medial thigh. There is subcutaneous soft tissue edema laterally most evident lateral to the knee. This is nonspecific.  IMPRESSION: 1. No fracture or dislocation. No bone lesion. Extensive bone demineralization.   Electronically Signed   By: Amie Portland M.D.   On: 05/23/2014 10:01   Ct Angio Chest Pe W/cm &/or Wo Cm  06/07/2014   CLINICAL DATA:  Recent history DVT with new shortness of Breath  EXAM: CT ANGIOGRAPHY CHEST WITH CONTRAST  TECHNIQUE: Multidetector CT imaging of the chest was performed using the standard protocol during bolus administration of intravenous contrast. Multiplanar CT image reconstructions and MIPs were obtained to evaluate the vascular anatomy.  CONTRAST:  OMNIPAQUE IOHEXOL 350 MG/ML SOLN  COMPARISON:  None.  FINDINGS: Lungs are well aerated bilaterally without focal infiltrate or sizable effusion.  The thoracic aorta is within normal limits without dissection. The pulmonary artery is well visualized and demonstrates evidence of right middle lobe pulmonary emboli. No large central pulmonary embolus is seen. There are no findings to suggest right heart strain. No hilar or mediastinal adenopathy is noted. Coronary calcifications are noted.  Scanning into the upper abdomen reveals no acute abnormality. The osseous structures are within normal limits.  Review of the MIP  images  confirms the above findings.  IMPRESSION: Right middle lobe pulmonary emboli.  No other focal abnormality is seen.   Electronically Signed   By: Alcide Clever M.D.   On: 06/07/2014 16:21   Dg Chest Port 1 View  06/07/2014   CLINICAL DATA:  Short of breath  EXAM: PORTABLE CHEST - 1 VIEW  COMPARISON:  05/23/2014  FINDINGS: Mild cardiomegaly. Left upper lobe spiculated density. Right lung is clear. No pneumothorax or pleural effusion  IMPRESSION: Left upper lobe spiculated density. Upright PA and lateral chest radiographs are recommended when feasible.   Electronically Signed   By: Maryclare Bean M.D.   On: 06/07/2014 14:47    1. Pulmonary embolism on right   2. DVT (deep venous thrombosis), left   3. Hypoxia   4. Hypokalemia    I have reviewed nursing notes, vital signs, and all appropriate lab and imaging results for this patient.   Patient presenting for CP, SOB with new O2 requirement from home after recently being diagnosed with Left DVT. She has been unable to afford the Xarelto. CT angio chest shows right PE w/o right heart strain. Heparin started. Patient hypokalemic, replacement potassium ordered. Will admit to the hospital for further treatment and management. Patient d/w with Dr. Loretha Stapler, agrees with plan.    Jeannetta Ellis, PA-C 06/07/14 1846

## 2014-06-07 NOTE — H&P (Signed)
[facility] History and Physical  Ann DonnaWillie Mae Kennan RUE:[id number]:[id number] DOB: 08/29/1926 DOA: 06/07/2014  Referring physician: Emergency Department PCP: Tommie RaymondWilson, Amelia P, MD  Specialists:   Chief Complaint: SOB  HPI: Ann Kelley is a 80 y.o. female  With a hx of recently diagnosed LLE DVT in 7/18 in the ED. Pt was discharged from the ED with xarelto with instructions to f/u with PCP. Today, family reportedly had been unable to afford xarelto, thus had been giving ASA to the patient for tx of her DVT. Pt now presents to ED after family notes increasing sob, intermittent chest pains. In the ED, pt found to have an acute R middle lobe PE. Pt was started on heparin gtt and hospitalist consulted for admission.  Review of Systems:   Per above, the remainder of the 10pt ros reviewed and are neg  Past Medical History  Diagnosis Date  . Hypertension   . Dementia    History reviewed. No pertinent past surgical history. Social History:  reports that she has been smoking.  She does not have any smokeless tobacco history on file. She reports that she does not drink alcohol or use illicit drugs.  where does patient live--home, ALF, SNF? and with whom if at home?  Can patient participate in ADLs?  No Known Allergies  History reviewed. No pertinent family history.  (be sure to complete)  Prior to Admission medications   Medication Sig Start Date End Date Taking? Authorizing Provider  donepezil (ARICEPT) 5 MG tablet Take 5 mg by mouth every morning.  05/12/14  Yes Historical Provider, MD  HYDROcodone-acetaminophen (NORCO) 5-325 MG per tablet Take 1 tablet by mouth every 6 (six) hours as needed. 05/23/14  Yes Enid SkeensJoshua M Zavitz, MD  Multiple Vitamin (MULTIVITAMIN WITH MINERALS) TABS tablet Take 1 tablet by mouth daily.   Yes Historical Provider, MD  valsartan-hydrochlorothiazide (DIOVAN-HCT) 320-12.5 MG per tablet Take 1 tablet by mouth daily.  05/11/14  Yes Historical Provider, MD  Rivaroxaban (XARELTO)  15 MG TABS tablet Take 1 tablet (15 mg total) by mouth 2 (two) times daily. 05/23/14   Enid SkeensJoshua M Zavitz, MD   Physical Exam: Filed Vitals:   06/07/14 1352 06/07/14 1532  BP: 149/81 134/80  Pulse: 100 102  Temp: 98.5 F (36.9 C) 98.4 F (36.9 C)  TempSrc: Oral Oral  Resp: 20 19  Height: 5\' 3"  (1.6 m)   Weight: 58.968 kg (130 lb)   SpO2: 98% 98%     General:  Awake, in nad  Eyes: PERRL B  ENT: membranes moist, dentition fair  Neck: trachea midline, neck supple  Cardiovascular: regular, s1, s2  Respiratory: normal resp effort, no wheezing  Abdomen: soft, nondistended  Skin: normal skin turgor, no abnormal skin lesions seen  Musculoskeletal: perfused, no clubbing, 2+ LLE swelling  Psychiatric: confused, no auditory/visual hallucinations  Neurologic: cn2-12 grossly intact, strength/sensation intact  Labs on Admission:  Basic Metabolic Panel:  Recent Labs Lab 06/07/14 1417 06/07/14 1436  NA 145 142  K 3.1* 3.0*  CL 99 97  CO2 37*  --   GLUCOSE 108* 103*  BUN 9 7  CREATININE 0.46* 0.60  CALCIUM 9.6  --    Liver Function Tests:  Recent Labs Lab 06/07/14 1417  AST 17  ALT 7  ALKPHOS 95  BILITOT 0.2*  PROT 6.9  ALBUMIN 3.0*   No results found for this basename: LIPASE, AMYLASE,  in the last 168 hours No results found for this basename: AMMONIA,  in the  last 168 hours CBC:  Recent Labs Lab 06/07/14 1417 06/07/14 1436  WBC 6.3  --   NEUTROABS 3.5  --   HGB 10.3* 12.2  HCT 33.7* 36.0  MCV 84.0  --   PLT 322  --    Cardiac Enzymes: No results found for this basename: CKTOTAL, CKMB, CKMBINDEX, TROPONINI,  in the last 168 hours  BNP (last 3 results)  Recent Labs  05/23/14 1016  PROBNP 127.2   CBG: No results found for this basename: GLUCAP,  in the last 168 hours  Radiological Exams on Admission: Ct Angio Chest Pe W/cm &/or Wo Cm  06/07/2014   CLINICAL DATA:  Recent history DVT with new shortness of Breath  EXAM: CT ANGIOGRAPHY CHEST WITH  CONTRAST  TECHNIQUE: Multidetector CT imaging of the chest was performed using the standard protocol during bolus administration of intravenous contrast. Multiplanar CT image reconstructions and MIPs were obtained to evaluate the vascular anatomy.  CONTRAST:  OMNIPAQUE IOHEXOL 350 MG/ML SOLN  COMPARISON:  None.  FINDINGS: Lungs are well aerated bilaterally without focal infiltrate or sizable effusion.  The thoracic aorta is within normal limits without dissection. The pulmonary artery is well visualized and demonstrates evidence of right middle lobe pulmonary emboli. No large central pulmonary embolus is seen. There are no findings to suggest right heart strain. No hilar or mediastinal adenopathy is noted. Coronary calcifications are noted.  Scanning into the upper abdomen reveals no acute abnormality. The osseous structures are within normal limits.  Review of the MIP images confirms the above findings.  IMPRESSION: Right middle lobe pulmonary emboli.  No other focal abnormality is seen.   Electronically Signed   By: Alcide Clever M.D.   On: 06/07/2014 16:21   Dg Chest Port 1 View  06/07/2014   CLINICAL DATA:  Short of breath  EXAM: PORTABLE CHEST - 1 VIEW  COMPARISON:  05/23/2014  FINDINGS: Mild cardiomegaly. Left upper lobe spiculated density. Right lung is clear. No pneumothorax or pleural effusion  IMPRESSION: Left upper lobe spiculated density. Upright PA and lateral chest radiographs are recommended when feasible.   Electronically Signed   By: Maryclare Bean M.D.   On: 06/07/2014 14:47    EKG: Independently reviewed. NSR  Assessment/Plan Principal Problem:   Pulmonary embolism Active Problems:   HTN (hypertension)   Dementia   DVT (deep venous thrombosis)   Tachycardia  1. Acute Pulmonary Embolism 1. Likely secondary to not treated DVT 2. Will continue on heparin gtt for now 3. Will consult case management for assistance with anticoagulant choices on discharge that pt can afford 4. Admit  to med-tele 2. DVT 1. Heparin gtt and anticoagulation per above 3. HTN 1. BP stable 2. Cont home meds 4. Dementia 1. Stable 2. Cont Aricept 5. Tachycardia 1. Likely secondary to acute PE 2. Monitor for now 6. Dispo 1. Lives with daughter 2. Will consult PT/OT for d/c planning needs  Code Status: Full Family Communication: Pt and daughters at bedside  Disposition Plan: Home when stable  Time spent:  Porchia Sinkler, Scheryl Marten [facility] Pager [phone]  If 7PM-7AM, please contact night-coverage [email or web address] Password [facility] 06/07/2014, 4:50 PM

## 2014-06-08 DIAGNOSIS — I2699 Other pulmonary embolism without acute cor pulmonale: Secondary | ICD-10-CM | POA: Diagnosis not present

## 2014-06-08 DIAGNOSIS — I10 Essential (primary) hypertension: Secondary | ICD-10-CM

## 2014-06-08 DIAGNOSIS — E876 Hypokalemia: Secondary | ICD-10-CM

## 2014-06-08 LAB — HEPARIN LEVEL (UNFRACTIONATED)
HEPARIN UNFRACTIONATED: 0.47 [IU]/mL (ref 0.30–0.70)
Heparin Unfractionated: 0.67 IU/mL (ref 0.30–0.70)

## 2014-06-08 LAB — COMPREHENSIVE METABOLIC PANEL
ALK PHOS: 86 U/L (ref 39–117)
ALT: 7 U/L (ref 0–35)
AST: 16 U/L (ref 0–37)
Albumin: 3 g/dL — ABNORMAL LOW (ref 3.5–5.2)
Anion gap: 13 (ref 5–15)
BILIRUBIN TOTAL: 0.5 mg/dL (ref 0.3–1.2)
BUN: 7 mg/dL (ref 6–23)
CALCIUM: 9.3 mg/dL (ref 8.4–10.5)
CHLORIDE: 98 meq/L (ref 96–112)
CO2: 33 meq/L — AB (ref 19–32)
Creatinine, Ser: 0.39 mg/dL — ABNORMAL LOW (ref 0.50–1.10)
GFR calc Af Amer: >90 mL/min (ref >90)
Glucose, Bld: 84 mg/dL (ref 70–99)
POTASSIUM: 3.1 meq/L — AB (ref 3.7–5.3)
SODIUM: 144 meq/L (ref 137–147)
Total Protein: 6.7 g/dL (ref 6.0–8.3)

## 2014-06-08 LAB — CBC
HCT: 33.4 % — ABNORMAL LOW (ref 36.0–46.0)
Hemoglobin: 10 g/dL — ABNORMAL LOW (ref 12.0–15.0)
MCH: 25 pg — ABNORMAL LOW (ref 26.0–34.0)
MCHC: 29.9 g/dL — ABNORMAL LOW (ref 30.0–36.0)
MCV: 83.5 fL (ref 78.0–100.0)
PLATELETS: 313 10*3/uL (ref 150–400)
RBC: 4 MIL/uL (ref 3.87–5.11)
RDW: 18 % — ABNORMAL HIGH (ref 11.5–15.5)
WBC: 7.8 10*3/uL (ref 4.0–10.5)

## 2014-06-08 MED ORDER — POTASSIUM CHLORIDE CRYS ER 20 MEQ PO TBCR
40.0000 meq | EXTENDED_RELEASE_TABLET | ORAL | Status: AC
  Administered 2014-06-08 (×2): 40 meq via ORAL
  Filled 2014-06-08 (×3): qty 2

## 2014-06-08 MED ORDER — APIXABAN 5 MG PO TABS
10.0000 mg | ORAL_TABLET | Freq: Two times a day (BID) | ORAL | Status: DC
  Administered 2014-06-08 – 2014-06-09 (×3): 10 mg via ORAL
  Filled 2014-06-08 (×4): qty 2

## 2014-06-08 MED ORDER — HALOPERIDOL LACTATE 5 MG/ML IJ SOLN
0.5000 mg | Freq: Two times a day (BID) | INTRAMUSCULAR | Status: DC | PRN
  Administered 2014-06-08: 0.5 mg via INTRAVENOUS
  Filled 2014-06-08: qty 1

## 2014-06-08 NOTE — Progress Notes (Addendum)
[facility] PROGRESS NOTE  Ann DonnaWillie Mae Kelley [id number] DOB: 12/05/1925 DOA: 06/07/2014 PCP: Tommie RaymondWilson, Amelia P, MD  Assessment/Plan: 1-SOB and pleuritic CP: due to acute PE -patient with recent hx of DVT due to immobility; unable to acquire anticoagulation therapy as prescribed. -presented with pleuritic CP and SOB; CTA demonstrated PE -review with CM and insurance company for availability/capability for eliquis treatment; priro authorization filled out and patient co-pay will be only 60 dollars -will switch to eliquis for anticoagulation therapy  2-DVT: subacute -as mentioned above will treat with eliquis  3-HTN: stable and wll controlled -will continue current medication regimen  4-dementia: oriented only to person. -continue aricept -haldol low dose PRN for agitation -avoid use on benzodiazepine  5-hypokalemia: most likely due to use of diuretics -will replete and check BMET in am   Code Status: Full Family Communication: Daughter at bedside  [facility]   Consultants:  None   Procedures:  See below for x-ray reports   Antibiotics:  None   HPI/Subjective: Afebrile; oriented only to person (not new according to family member at bedside); denies SOB or CP. Patient was really agitated overnight and received ativan; easily aroused but somnolent today (8/3)  Objective: Filed Vitals:   06/08/14 0655  BP: 149/69  Pulse: 86  Temp: 98.2 F (36.8 C)  Resp: 18    Intake/Output Summary (Last 24 hours) at 06/08/14 1249 Last data filed at 06/08/14 0700  Gross per 24 hour  Intake    130 ml  Output      1 ml  Net    129 ml   Filed Weights   06/07/14 1352 06/07/14 1749  Weight: 58.968 kg (130 lb) 57.9 kg (127 lb 10.3 oz)    Exam:   General:  Oriented to person only; afebrile; denies CP or SOB  Cardiovascular: S1 and S2, no rubs or gallops  Respiratory: good air movement, no wheezing, no crackles  Abdomen: soft, NT,  ND, positive BS  Musculoskeletal: 1-2 plus edema bilaterally (L > R)  Data Reviewed: Basic Metabolic Panel:  Recent Labs Lab 06/07/14 1417 06/07/14 1436 06/08/14 0431  NA 145 142 144  K 3.1* 3.0* 3.1*  CL 99 97 98  CO2 37*  --  33*  GLUCOSE 108* 103* 84  BUN 9 7 7   CREATININE 0.46* 0.60 0.39*  CALCIUM 9.6  --  9.3   Liver Function Tests:  Recent Labs Lab 06/07/14 1417 06/08/14 0431  AST 17 16  ALT 7 7  ALKPHOS 95 86  BILITOT 0.2* 0.5  PROT 6.9 6.7  ALBUMIN 3.0* 3.0*   CBC:  Recent Labs Lab 06/07/14 1417 06/07/14 1436 06/08/14 0431  WBC 6.3  --  7.8  NEUTROABS 3.5  --   --   HGB 10.3* 12.2 10.0*  HCT 33.7* 36.0 33.4*  MCV 84.0  --  83.5  PLT 322  --  313   BNP (last 3 results)  Recent Labs  05/23/14 1016  PROBNP 127.2     Studies: Ct Angio Chest Pe W/cm &/or Wo Cm  06/07/2014   CLINICAL DATA:  Recent history DVT with new shortness of Breath  EXAM: CT ANGIOGRAPHY CHEST WITH CONTRAST  TECHNIQUE: Multidetector CT imaging of the chest was performed using the standard protocol during bolus administration of intravenous contrast. Multiplanar CT image reconstructions and MIPs were obtained to evaluate the vascular anatomy.  CONTRAST:  100mL OMNIPAQUE IOHEXOL 350 MG/ML SOLN  COMPARISON:  None.  FINDINGS:  Lungs are well aerated bilaterally without focal infiltrate or sizable effusion.  The thoracic aorta is within normal limits without dissection. The pulmonary artery is well visualized and demonstrates evidence of right middle lobe pulmonary emboli. No large central pulmonary embolus is seen. There are no findings to suggest right heart strain. No hilar or mediastinal adenopathy is noted. Coronary calcifications are noted.  Scanning into the upper abdomen reveals no acute abnormality. The osseous structures are within normal limits.  Review of the MIP images confirms the above findings.  IMPRESSION: Right middle lobe pulmonary emboli.  No other focal abnormality is  seen.   Electronically Signed   By: Alcide Clever M.D.   On: 06/07/2014 16:21   Dg Chest Port 1 View  06/07/2014   CLINICAL DATA:  Short of breath  EXAM: PORTABLE CHEST - 1 VIEW  COMPARISON:  05/23/2014  FINDINGS: Mild cardiomegaly. Left upper lobe spiculated density. Right lung is clear. No pneumothorax or pleural effusion  IMPRESSION: Left upper lobe spiculated density. Upright PA and lateral chest radiographs are recommended when feasible.   Electronically Signed   By: Maryclare Bean M.D.   On: 06/07/2014 14:47    Scheduled Meds: . apixaban  10 mg Oral BID  . donepezil  5 mg Oral q morning - 10a  . irbesartan  300 mg Oral Daily   And  . hydrochlorothiazide  12.5 mg Oral Daily  . nicotine  14 mg Transdermal Daily  . potassium chloride  10 mEq Intravenous Once  . potassium chloride  40 mEq Oral Q4H  . sodium chloride  3 mL Intravenous Q12H   Continuous Infusions:   Principal Problem:   Pulmonary embolism Active Problems:   HTN (hypertension)   Dementia   DVT (deep venous thrombosis)   Tachycardia    Time spent: >30 minutes    Vassie Loll  [facility] Pager [phone]. If 7PM-7AM, please contact night-coverage at [email or web address], password [facility] 06/08/2014, 12:49 PM  LOS: 1 day

## 2014-06-08 NOTE — Progress Notes (Signed)
OT Cancellation Note  Patient Details Name: Ann Kelley MRN: [id number] DOB: 06/16/1926   Cancelled Treatment:    Reason Eval/Treat Not Completed: Other (comment) Pt just started on heparin. Will wait the 24 hours and check back on pt in am and see pt as appropriate. Thanks.  Lennox LaityStone, Zayah Keilman Stafford [phone] 06/08/2014, 12:13 PM

## 2014-06-08 NOTE — Progress Notes (Signed)
ANTICOAGULATION CONSULT NOTE - Follow Up Consult  Pharmacy Consult for Heparin Indication: DVT  No Known Allergies  Patient Measurements: Height: 5\' 3"  (160 cm) Weight: 127 lb 10.3 oz (57.9 kg) IBW/kg (Calculated) : 52.4 Heparin Dosing Weight:   Vital Signs: Temp: 98.1 F (36.7 C) (08/03 0014) Temp src: Oral (08/03 0014) BP: 171/74 mmHg (08/03 0014) Pulse Rate: 92 (08/03 0014)  Labs:  Recent Labs  06/07/14 1417 06/07/14 1432 06/07/14 1436 06/08/14  HGB 10.3*  --  12.2  --   HCT 33.7*  --  36.0  --   PLT 322  --   --   --   APTT  --  35  --   --   LABPROT  --  13.1  --   --   INR  --  0.99  --   --   HEPARINUNFRC  --   --   --  0.47  CREATININE 0.46*  --  0.60  --     Estimated Creatinine Clearance: 41.8 ml/min (by C-G formula based on Cr of 0.6).   Medications:  Infusions:  . heparin 1,000 Units/hr (06/07/14 1547)    Assessment: Patient with heparin level at goal.  No issues noted.  Goal of Therapy:  Heparin level 0.3-0.7 units/ml Monitor platelets by anticoagulation protocol: Yes   Plan:  Continue heparin drip at current rate Recheck level at 0800  Darlina GuysGrimsley Jr, SteptoeJulian Crowford 06/08/2014,5:20 AM

## 2014-06-08 NOTE — Progress Notes (Signed)
CARE MANAGEMENT NOTE 06/08/2014  Patient:  Ann Kelley,Ann Kelley   Account Number:  [id number]  Date Initiated:  06/08/2014  Documentation initiated by:  Ezekiel InaMcGIBBONEY,COOKIE  Subjective/Objective Assessment:   PT admitted with DX Pulmonary Embolism     Action/Plan:   from home   Anticipated DC Date:  06/09/2014   Anticipated DC Plan:  HOME/SELF CARE      DC Planning Services  CM consult      Choice offered to / List presented to:             Status of service:  In process, will continue to follow Medicare Important Message given?  NA - LOS <3 / Initial given by admissions (If response is "NO", the following Medicare IM given date fields will be blank) Date Medicare IM given:   Medicare IM given by:   Date Additional Medicare IM given:   Additional Medicare IM given by:    Discharge Disposition:    Per UR Regulation:  Reviewed for med. necessity/level of care/duration of stay  If discussed at Long Length of Stay Meetings, dates discussed:    Comments:  06/08/14 MMcGibboney, RN, BSN A call to [employer] [phone]  FOR PRIOR APPROVAL. Spoke with Jocelyn, pt is approved for Eliquis $60.00 co-pay for 5mg  BID. Pt's daughter and MD is aware. A call to Eliquis revealed that the Pt will not qualify for Eliquis $10.00 a month program related to her having Medicare insurance. S/W RHONDA  @  [phone] * ELIQUIS  5 MG   BID     ( 10 MG - QD - NO) COVER-YES TIER-3 DRUG CO-PAY - $ 45.00 FOR 30 DAYS SUPPLY PRIOR APPROVAL - YES - # [phone] [facility] : STANDARD * LOVENOX  100 MG  SQ BID FOR 14 DAYS  THEN QD COVER-YES TIER-5 DRUG CO-PAY - 33 % FOR 30  DAYS SUPPLY  ( SAME ) PRIOR APPROVAL - NO PHARMACY : STANDARD

## 2014-06-08 NOTE — Progress Notes (Signed)
PT Cancellation Note / Screen  Patient Details Name: Ann Kelley MRN: [id number] DOB: 10/20/1926   Cancelled Treatment:    Reason Eval/Treat Not Completed: PT screened, no needs identified, will sign off Spoke with pt's daughter who is her caregiver.  She reports pt is total care and bedbound and states pt's leg is hurting too much for therapy at this time.  Pt's daughter hopes to get hospital bed to assist with decreasing burden of care, and she would like HHPT to assist with home setup and education on safely mobilizing.  Pt's daughter prefers therapy at home and declines acute PT at this time.  Also mentioned SNF however daughter reports plan is for pt to return home.   Sharlize Hoar,KATHrine E 06/08/2014, 1:54 PM Zenovia JarredKati Mann Skaggs, PT, DPT 06/08/2014 Pager: [phone]

## 2014-06-08 NOTE — Progress Notes (Signed)
ANTICOAGULATION CONSULT NOTE - Initial Consult  Pharmacy Consult for IV Heparin Indication: Recent DVT (7/18) / new SOB/CP  No Known Allergies  Patient Measurements: Height: 5\' 3"  (160 cm) Weight: 127 lb 10.3 oz (57.9 kg) IBW/kg (Calculated) : 52.4   Vital Signs: Temp: 98.2 F (36.8 C) (08/03 0655) Temp src: Oral (08/03 0655) BP: 149/69 mmHg (08/03 0655) Pulse Rate: 86 (08/03 0655)  Labs:  Recent Labs  06/07/14 1417 06/07/14 1432 06/07/14 1436 06/08/14 06/08/14 0431 06/08/14 0735  HGB 10.3*  --  12.2  --  10.0*  --   HCT 33.7*  --  36.0  --  33.4*  --   PLT 322  --   --   --  313  --   APTT  --  35  --   --   --   --   LABPROT  --  13.1  --   --   --   --   INR  --  0.99  --   --   --   --   HEPARINUNFRC  --   --   --  0.47  --  0.67  CREATININE 0.46*  --  0.60  --  0.39*  --     Estimated Creatinine Clearance: 41.8 ml/min (by C-G formula based on Cr of 0.39).   Medical History: Past Medical History  Diagnosis Date  . Hypertension   . Dementia     Assessment: 80 yo with recent diagnosis of LLE DVT. Pt never picked up Rx for Xarelto.   HL therapeutic with heparin running at a rate of 1000 units/hr currently but rose from 0.47 to 0.67  Hgb down but OK, plts stable  No reported bleeding  Goal of Therapy:  Heparin level 0.3-0.7 units/ml Monitor platelets by anticoagulation protocol: Yes   Plan:  1) Due to rise in heparin level to upper limit of normal range, reduce rate from 1000 to 900 units/hr 2) Recheck heparin level 8 hours after change in rate   Hessie KnowsJustin M Shanna Un, PharmD, BCPS Pager [phone] 06/08/2014 8:56 AM

## 2014-06-09 DIAGNOSIS — R0902 Hypoxemia: Secondary | ICD-10-CM

## 2014-06-09 LAB — BASIC METABOLIC PANEL
ANION GAP: 8 (ref 5–15)
BUN: 13 mg/dL (ref 6–23)
CALCIUM: 9.3 mg/dL (ref 8.4–10.5)
CO2: 33 mEq/L — ABNORMAL HIGH (ref 19–32)
Chloride: 99 mEq/L (ref 96–112)
Creatinine, Ser: 0.48 mg/dL — ABNORMAL LOW (ref 0.50–1.10)
GFR calc Af Amer: >90 mL/min (ref >90)
GFR calc non Af Amer: 86 mL/min — ABNORMAL LOW (ref >90)
GLUCOSE: 101 mg/dL — AB (ref 70–99)
Potassium: 4.4 mEq/L (ref 3.7–5.3)
SODIUM: 140 meq/L (ref 137–147)

## 2014-06-09 LAB — CBC
HCT: 34 % — ABNORMAL LOW (ref 36.0–46.0)
Hemoglobin: 10.1 g/dL — ABNORMAL LOW (ref 12.0–15.0)
MCH: 25.2 pg — AB (ref 26.0–34.0)
MCHC: 29.7 g/dL — ABNORMAL LOW (ref 30.0–36.0)
MCV: 84.8 fL (ref 78.0–100.0)
PLATELETS: 262 10*3/uL (ref 150–400)
RBC: 4.01 MIL/uL (ref 3.87–5.11)
RDW: 18.1 % — ABNORMAL HIGH (ref 11.5–15.5)
WBC: 6 10*3/uL (ref 4.0–10.5)

## 2014-06-09 MED ORDER — HYDROCODONE-ACETAMINOPHEN 5-325 MG PO TABS: 1.0000 | ORAL_TABLET | Freq: Three times a day (TID) | ORAL | Status: DC | PRN

## 2014-06-09 MED ORDER — PANTOPRAZOLE SODIUM 40 MG PO TBEC: 40.0000 mg | DELAYED_RELEASE_TABLET | Freq: Every day | ORAL | Status: AC

## 2014-06-09 MED ORDER — APIXABAN 5 MG PO TABS: 5.0000 mg | ORAL_TABLET | Freq: Two times a day (BID) | ORAL | Status: AC

## 2014-06-09 MED ORDER — PANTOPRAZOLE SODIUM 40 MG PO TBEC
40.0000 mg | DELAYED_RELEASE_TABLET | Freq: Every day | ORAL | Status: DC
  Administered 2014-06-09: 40 mg via ORAL
  Filled 2014-06-09: qty 1

## 2014-06-09 MED ORDER — APIXABAN 5 MG PO TABS: 10.0000 mg | ORAL_TABLET | Freq: Two times a day (BID) | ORAL | Status: DC

## 2014-06-09 NOTE — Discharge Summary (Signed)
Physician Discharge Summary  Marjean DonnaWillie Mae Lillibridge ZOX:[id number] DOB: 02/05/1926 DOA: 06/07/2014  PCP: Tommie RaymondWilson, Amelia P, MD  Admit date: 06/07/2014 Discharge date: 06/09/2014  Time spent: >30 minutes  Recommendations for Outpatient Follow-up:  CBC to follow Hgb trend BMET to follow electrolytes and renal function  Discharge Diagnoses:  Principal Problem:   Pulmonary embolism Active Problems:   HTN (hypertension)   Dementia   DVT (deep venous thrombosis)   Tachycardia Hypokalemia  Discharge Condition: stable and improved. Will discharge home with family care. Follow up with PCP in 10 days  Diet recommendation: heart healthy  Filed Weights   06/07/14 1352 06/07/14 1749  Weight: 58.968 kg (130 lb) 57.9 kg (127 lb 10.3 oz)    History of present illness:  80 y.o. female With a hx of recently diagnosed LLE DVT in 7/18 in the ED. Pt was discharged from the ED with xarelto with instructions to f/u with PCP. Today, family reportedly had been unable to afford xarelto, thus had been giving ASA to the patient for tx of her DVT. Pt now presents to ED after family notes increasing sob, intermittent chest pains. In the ED, pt found to have an acute R middle lobe PE. Pt was started on heparin gtt and hospitalist consulted for admission.   Hospital Course:  1-SOB and pleuritic CP: due to acute PE  -patient with recent hx of DVT due to immobility; unable to acquire anticoagulation therapy as previously prescribed due to cost.  -presented with pleuritic CP and SOB; CTA demonstrated PE  -review with CM and insurance company for availability/capability for eliquis treatment; prior authorization filled out and patient co-pay will be only 60 dollars (which can be afford it) -will discharge on eliquis for anticoagulation therapy (will required at least 6 months therapy) -follow up with PCP in 10 days after discharge  2-DVT: subacute  -as mentioned above will treat her with eliquis   3-HTN: stable and well  controlled  -will continue current medication regimen   4-dementia: oriented only to person.  -continue aricept  -continue supportive care  5-hypokalemia: most likely due to use of diuretics and decrease PO intake -repleted and WNL at discharge  Procedures:  See below for x-ray reports   Consultations:  None   Discharge Exam: Filed Vitals:   06/09/14 0427  BP: 133/79  Pulse: 87  Temp: 97.6 F (36.4 C)  Resp: 18   General: Oriented to person only; afebrile; denies CP or SOB. According to daughter at bedside patient at baseline  Cardiovascular: S1 and S2, no rubs or gallops  Respiratory: good air movement, no wheezing, no crackles; good O2 sat on RA Abdomen: soft, NT, ND, positive BS  Musculoskeletal: 1-2 plus edema bilaterally (L > R)   Discharge Instructions You were cared for by a hospitalist during your hospital stay. If you have any questions about your discharge medications or the care you received while you were in the hospital after you are discharged, you can call the unit and asked to speak with the hospitalist on call if the hospitalist that took care of you is not available. Once you are discharged, your primary care physician will handle any further medical issues. Please note that NO REFILLS for any discharge medications will be authorized once you are discharged, as it is imperative that you return to your primary care physician (or establish a relationship with a primary care physician if you do not have one) for your aftercare needs so that they can reassess your  need for medications and monitor your lab values.  Discharge Instructions   Discharge instructions    Complete by:  As directed   Follow a heart healthy diet Take medications as prescribed Arrange follow up with PCP in 1 week            Medication List    STOP taking these medications       Rivaroxaban 15 MG Tabs tablet  Commonly known as:  XARELTO      TAKE these medications        apixaban 5 MG Tabs tablet  Commonly known as:  ELIQUIS  Take 2 tablets (10 mg total) by mouth 2 (two) times daily.     apixaban 5 MG Tabs tablet  Commonly known as:  ELIQUIS  Take 1 tablet (5 mg total) by mouth 2 (two) times daily.  Start taking on:  06/16/2014     donepezil 5 MG tablet  Commonly known as:  ARICEPT  Take 5 mg by mouth every morning.     HYDROcodone-acetaminophen 5-325 MG per tablet  Commonly known as:  NORCO  Take 1 tablet by mouth every 8 (eight) hours as needed for severe pain.     multivitamin with minerals Tabs tablet  Take 1 tablet by mouth daily.     pantoprazole 40 MG tablet  Commonly known as:  PROTONIX  Take 1 tablet (40 mg total) by mouth daily at 12 noon.     valsartan-hydrochlorothiazide 320-12.5 MG per tablet  Commonly known as:  DIOVAN-HCT  Take 1 tablet by mouth daily.       No Known Allergies   The results of significant diagnostics from this hospitalization (including imaging, microbiology, ancillary and laboratory) are listed below for reference.    Significant Diagnostic Studies: Dg Chest 2 View  05/23/2014   CLINICAL DATA:  Left lower extremity edema.  EXAM: CHEST - 2 VIEW  COMPARISON:  12/19/2010  FINDINGS: Lung volumes are low bilaterally. Stable bilateral pulmonary scarring. There is no evidence of pulmonary edema, consolidation, pneumothorax, nodule or pleural fluid. The heart size is stable. There is stable tortuosity of the thoracic aorta. Visualized bony structures show stable osteopenia and mild degenerative changes of the thoracic spine without visible fracture.  IMPRESSION: No active disease.  Stable pulmonary scarring.   Electronically Signed   By: Irish Lack M.D.   On: 05/23/2014 10:00   Dg Pelvis 1-2 Views  05/23/2014   CLINICAL DATA:  Hypertension.  Leg swelling.  EXAM: PELVIS - 1-2 VIEW  COMPARISON:  None.  FINDINGS: No fracture. No dislocation. No significant arthropathic changes. The bones are extensively demineralized.  Vascular calcifications are noted along the aorta and iliac arteries. Soft tissues are otherwise unremarkable. Mid and lower sacrum is not well visualized on this exam due to overlying bowel gas and stool and bone demineralization.  IMPRESSION: No fracture or bone lesion.  No acute finding.   Electronically Signed   By: Amie Portland M.D.   On: 05/23/2014 10:00   Dg Femur Left  05/23/2014   CLINICAL DATA:  Left leg pain.  EXAM: LEFT FEMUR - 2 VIEW  COMPARISON:  None.  FINDINGS: No fracture. No dislocation. No bone lesion. Bones are diffusely demineralized. There are vascular calcifications along the medial thigh. There is subcutaneous soft tissue edema laterally most evident lateral to the knee. This is nonspecific.  IMPRESSION: 1. No fracture or dislocation. No bone lesion. Extensive bone demineralization.   Electronically Signed   By: Onalee Hua  Ormond M.D.   On: 05/23/2014 10:01   Ct Angio Chest Pe W/cm &/or Wo Cm  06/07/2014   CLINICAL DATA:  Recent history DVT with new shortness of Breath  EXAM: CT ANGIOGRAPHY CHEST WITH CONTRAST  TECHNIQUE: Multidetector CT imaging of the chest was performed using the standard protocol during bolus administration of intravenous contrast. Multiplanar CT image reconstructions and MIPs were obtained to evaluate the vascular anatomy.  CONTRAST:  OMNIPAQUE IOHEXOL 350 MG/ML SOLN  COMPARISON:  None.  FINDINGS: Lungs are well aerated bilaterally without focal infiltrate or sizable effusion.  The thoracic aorta is within normal limits without dissection. The pulmonary artery is well visualized and demonstrates evidence of right middle lobe pulmonary emboli. No large central pulmonary embolus is seen. There are no findings to suggest right heart strain. No hilar or mediastinal adenopathy is noted. Coronary calcifications are noted.  Scanning into the upper abdomen reveals no acute abnormality. The osseous structures are within normal limits.  Review of the MIP images confirms  the above findings.  IMPRESSION: Right middle lobe pulmonary emboli.  No other focal abnormality is seen.   Electronically Signed   By: Alcide Clever M.D.   On: 06/07/2014 16:21   Dg Chest Port 1 View  06/07/2014   CLINICAL DATA:  Short of breath  EXAM: PORTABLE CHEST - 1 VIEW  COMPARISON:  05/23/2014  FINDINGS: Mild cardiomegaly. Left upper lobe spiculated density. Right lung is clear. No pneumothorax or pleural effusion  IMPRESSION: Left upper lobe spiculated density. Upright PA and lateral chest radiographs are recommended when feasible.   Electronically Signed   By: Maryclare Bean M.D.   On: 06/07/2014 14:47   Labs: Basic Metabolic Panel:  Recent Labs Lab 06/07/14 1417 06/07/14 1436 06/08/14 0431 06/09/14 0540  NA 145 142 144 140  K 3.1* 3.0* 3.1* 4.4  CL 99 97 98 99  CO2 37*  --  33* 33*  GLUCOSE 108* 103* 84 101*  BUN 9 7 7 13   CREATININE 0.46* 0.60 0.39* 0.48*  CALCIUM 9.6  --  9.3 9.3   Liver Function Tests:  Recent Labs Lab 06/07/14 1417 06/08/14 0431  AST 17 16  ALT 7 7  ALKPHOS 95 86  BILITOT 0.2* 0.5  PROT 6.9 6.7  ALBUMIN 3.0* 3.0*   CBC:  Recent Labs Lab 06/07/14 1417 06/07/14 1436 06/08/14 0431 06/09/14 0540  WBC 6.3  --  7.8 6.0  NEUTROABS 3.5  --   --   --   HGB 10.3* 12.2 10.0* 10.1*  HCT 33.7* 36.0 33.4* 34.0*  MCV 84.0  --  83.5 84.8  PLT 322  --  313 262   BNP: BNP (last 3 results)  Recent Labs  05/23/14 1016  PROBNP 127.2    Signed:  Vassie Loll  [facility] 06/09/2014, 12:10 PM

## 2014-06-09 NOTE — Progress Notes (Signed)
[facility] the [facility] bed  If patient discharges after hours, please call [phone].   Renard HamperLecretia Williamson 06/09/2014, 12:29 PM

## 2014-06-09 NOTE — Progress Notes (Signed)
CSW consulted for transportation needs. Patient will need non-emergency ambulance transport home. CSW confirmed home address with patient's daughter, Sandra.   RN, Charline BillsKristyn will call [facility] for transport once equipment has been delivered [place].   No other CSW needs identified - CSW signing off.   Lincoln MaxinKelly Aurel Nguyen, LCSW [facility] Clinical Social Worker cell #: [phone]

## 2014-06-09 NOTE — ED Provider Notes (Signed)
Medical screening examination/treatment/procedure(s) were conducted as a shared visit with non-physician practitioner(s) and myself.  I personally evaluated the patient during the encounter.  Pt presenting with generalized "pain". She was recently diagnosed w/ DVT and placed on Xarelto which family could not afford. On PE, Pt has inc WOB, SOB and vague complaint of CP.  Suspect pna vs PE.  CTA chest to be ordered.    EKG Interpretation   Date/Time:  Sunday June 07 2014 14:37:34 EDT Ventricular Rate:  93 PR Interval:  138 QRS Duration: 72 QT Interval:  375 QTC Calculation: 466 R Axis:   27 Text Interpretation:  Sinus rhythm Atrial premature complex Anterior  infarct, old Nonspecific T abnormalities, lateral leads Baseline wander in  lead(s) V1 ED PHYSICIAN INTERPRETATION AVAILABLE IN [facility]  Confirmed by TEST, Record ([id number]) on 06/09/2014 7:06:47 AM        Shanna CiscoMegan E Yoceline Bazar, MD 06/09/14 1356

## 2014-06-09 NOTE — Care Management Note (Signed)
    Page 1 of 2   06/09/2014     12:41:36 PM CARE MANAGEMENT NOTE 06/09/2014  Patient:  Ann Kelley,Ann Kelley   Account Number:  [id number]  Date Initiated:  06/08/2014  Documentation initiated by:  Ezekiel InaMcGIBBONEY,COOKIE  Subjective/Objective Assessment:   PT admitted with DX Pulmonary Embolism     Action/Plan:   from home   Anticipated DC Date:  06/09/2014   Anticipated DC Plan:  HOME W HOME HEALTH SERVICES      DC Planning Services  CM consult      Choice offered to / List presented to:  C-4 Adult Children   DME arranged  HOSPITAL BED      DME agency  [facility]     [facility]  HH-1 RN  HH-4 NURSE'S AIDE      HH agency  [facility]   Status of service:  Completed, signed off Medicare Important Message given?  NA - LOS <3 / Initial given by admissions (If response is "NO", the following Medicare IM given date fields will be blank) Date Medicare IM given:  06/09/2014 Medicare IM given by:  [facility],Kala Ambriz Date Additional Medicare IM given:   Additional Medicare IM given by:    Discharge Disposition:  HOME W HOME HEALTH SERVICES  Per UR Regulation:  Reviewed for med. necessity/level of care/duration of stay  If discussed at Long Length of Stay Meetings, dates discussed:    Comments:  06/09/14 Demonte Dobratz RN,BSN NCM [phone] [facility] REP KRISTEN AWARE OF D/C, & HHRN/AIDE ORDERS.[facility] DME REP LECRETIA AWARE OF D/C, & HOSPITAL BED ORDER, SHE WILL MAKE ARRANGEMENTS W/DTR [facility] Kevorkian C#[phone] FOR DELIVERY OF DME.AMBULANCE TRANSP HOME-SW NOTIFIED.  06/08/14 MMcGibboney, RN, BSN Pt's daughter selected [facility], referral given to in house rep.  A call to [facility] [phone]  FOR PRIOR APPROVAL. Spoke with Jocelyn, pt is approved for Eliquis $60.00 co-pay for 5mg  BID. Pt's daughter and MD is aware.  A call to Eliquis revealed that the Pt will not qualify for Eliquis $10.00 a month program related to her having Medicare insurance.   S/W RHONDA   @  [phone]  * ELIQUIS  5 MG   BID     ( 10 MG - QD - NO)  COVER-YES TIER-3 DRUG CO-PAY - $ 45.00 FOR 30 DAYS SUPPLY PRIOR APPROVAL - YES - # [phone] [facility] : [facility]  * LOVENOX  100 MG  SQ BID FOR 14 DAYS  THEN QD  COVER-YES TIER-5 DRUG CO-PAY - 33 % FOR 30  DAYS SUPPLY  ( SAME ) PRIOR APPROVAL - NO PHARMACY : [facility]

## 2014-06-09 NOTE — Progress Notes (Signed)
OT Cancellation Note  Patient Details Name: Ann Kelley MRN: [id number] DOB: 11/02/1926   Cancelled Treatment:    Reason Eval/Treat Not Completed: OT screened, no needs identified, will sign off. Note pt is total care and plan is to return home with family. See PT note regarding pt's family's request to hold off on mobility currently and would like home therapy. Will defer acute OT. No family present when OT checked by.  Lennox LaityStone, [name] [phone] 06/09/2014, 12:26 PM

## 2014-06-09 NOTE — Discharge Instructions (Signed)
Information on my medicine - ELIQUIS (apixaban)  This medication education was reviewed with me or my healthcare representative as part of my discharge preparation.  The pharmacist that spoke with me during my hospital stay was:  Berkley HarveyLegge, Mayson Sterbenz Marshall, [facility] CenterRPH  Why was Eliquis prescribed for you? Eliquis was prescribed to treat blood clots that may have been found in the veins of your legs (deep vein thrombosis) or in your lungs (pulmonary embolism) and to reduce the risk of them occurring again.  What do You need to know about Eliquis ? The starting dose is 10 mg (two 5 mg tablets) taken TWICE daily for the FIRST SEVEN (7) DAYS, then on 06/15/14  the dose is reduced to ONE 5 mg tablet taken TWICE daily.  Eliquis may be taken with or without food.   Try to take the dose about the same time in the morning and in the evening. If you have difficulty swallowing the tablet whole please discuss with your pharmacist how to take the medication safely.  Take Eliquis exactly as prescribed and DO NOT stop taking Eliquis without talking to the doctor who prescribed the medication.  Stopping may increase your risk of developing a new blood clot.  Refill your prescription before you run out.  After discharge, you should have regular check-up appointments with your healthcare provider that is prescribing your Eliquis.    What do you do if you miss a dose? If a dose of ELIQUIS is not taken at the scheduled time, take it as soon as possible on the same day and twice-daily administration should be resumed. The dose should not be doubled to make up for a missed dose.  Important Safety Information A possible side effect of Eliquis is bleeding. You should call your healthcare provider right away if you experience any of the following:   Bleeding from an injury or your nose that does not stop.   Unusual colored urine (red or dark brown) or unusual colored stools (red or black).   Unusual bruising for unknown  reasons.   A serious fall or if you hit your head (even if there is no bleeding).  Some medicines may interact with Eliquis and might increase your risk of bleeding or clotting while on Eliquis. To help avoid this, consult your healthcare provider or pharmacist prior to using any new prescription or non-prescription medications, including herbals, vitamins, non-steroidal anti-inflammatory drugs (NSAIDs) and supplements.  This website has more information on Eliquis (apixaban): [email or web address].

## 2015-05-16 ENCOUNTER — Encounter (HOSPITAL_COMMUNITY): Payer: Self-pay | Admitting: Emergency Medicine

## 2015-05-16 ENCOUNTER — Inpatient Hospital Stay (HOSPITAL_COMMUNITY)
Admission: EM | Admit: 2015-05-16 | Discharge: 2015-05-18 | DRG: 070 | Disposition: A | Discharge status: Home Health | Payer: Medicare Other | Attending: Internal Medicine | Admitting: Internal Medicine

## 2015-05-16 ENCOUNTER — Emergency Department (HOSPITAL_BASED_OUTPATIENT_CLINIC_OR_DEPARTMENT_OTHER): Admit: 2015-05-16 | Discharge: 2015-05-16 | Disposition: A | Discharge status: Home / Self Care | Payer: Medicare Other

## 2015-05-16 DIAGNOSIS — N390 Urinary tract infection, site not specified: Secondary | ICD-10-CM | POA: Diagnosis present

## 2015-05-16 DIAGNOSIS — R531 Weakness: Secondary | ICD-10-CM | POA: Diagnosis not present

## 2015-05-16 DIAGNOSIS — L309 Dermatitis, unspecified: Secondary | ICD-10-CM | POA: Diagnosis present

## 2015-05-16 DIAGNOSIS — F0390 Unspecified dementia without behavioral disturbance: Secondary | ICD-10-CM | POA: Diagnosis present

## 2015-05-16 DIAGNOSIS — Z86711 Personal history of pulmonary embolism: Secondary | ICD-10-CM

## 2015-05-16 DIAGNOSIS — E860 Dehydration: Secondary | ICD-10-CM | POA: Diagnosis present

## 2015-05-16 DIAGNOSIS — D509 Iron deficiency anemia, unspecified: Secondary | ICD-10-CM | POA: Diagnosis present

## 2015-05-16 DIAGNOSIS — Z7901 Long term (current) use of anticoagulants: Secondary | ICD-10-CM

## 2015-05-16 DIAGNOSIS — R195 Other fecal abnormalities: Secondary | ICD-10-CM | POA: Diagnosis present

## 2015-05-16 DIAGNOSIS — L89152 Pressure ulcer of sacral region, stage 2: Secondary | ICD-10-CM | POA: Diagnosis present

## 2015-05-16 DIAGNOSIS — G9340 Encephalopathy, unspecified: Principal | ICD-10-CM | POA: Diagnosis present

## 2015-05-16 DIAGNOSIS — Z681 Body mass index (BMI) 19 or less, adult: Secondary | ICD-10-CM | POA: Diagnosis not present

## 2015-05-16 DIAGNOSIS — Z7401 Bed confinement status: Secondary | ICD-10-CM | POA: Diagnosis not present

## 2015-05-16 DIAGNOSIS — K219 Gastro-esophageal reflux disease without esophagitis: Secondary | ICD-10-CM | POA: Diagnosis present

## 2015-05-16 DIAGNOSIS — Z90710 Acquired absence of both cervix and uterus: Secondary | ICD-10-CM

## 2015-05-16 DIAGNOSIS — E43 Unspecified severe protein-calorie malnutrition: Secondary | ICD-10-CM | POA: Diagnosis present

## 2015-05-16 DIAGNOSIS — L89619 Pressure ulcer of right heel, unspecified stage: Secondary | ICD-10-CM | POA: Diagnosis present

## 2015-05-16 DIAGNOSIS — I10 Essential (primary) hypertension: Secondary | ICD-10-CM | POA: Diagnosis present

## 2015-05-16 DIAGNOSIS — R58 Hemorrhage, not elsewhere classified: Secondary | ICD-10-CM | POA: Diagnosis present

## 2015-05-16 DIAGNOSIS — R627 Adult failure to thrive: Secondary | ICD-10-CM | POA: Diagnosis present

## 2015-05-16 DIAGNOSIS — R32 Unspecified urinary incontinence: Secondary | ICD-10-CM | POA: Diagnosis present

## 2015-05-16 DIAGNOSIS — L89893 Pressure ulcer of other site, stage 3: Secondary | ICD-10-CM | POA: Diagnosis present

## 2015-05-16 DIAGNOSIS — F17210 Nicotine dependence, cigarettes, uncomplicated: Secondary | ICD-10-CM | POA: Diagnosis present

## 2015-05-16 DIAGNOSIS — E876 Hypokalemia: Secondary | ICD-10-CM | POA: Diagnosis present

## 2015-05-16 DIAGNOSIS — R64 Cachexia: Secondary | ICD-10-CM | POA: Diagnosis present

## 2015-05-16 DIAGNOSIS — R609 Edema, unspecified: Secondary | ICD-10-CM | POA: Diagnosis not present

## 2015-05-16 DIAGNOSIS — D62 Acute posthemorrhagic anemia: Secondary | ICD-10-CM | POA: Diagnosis present

## 2015-05-16 DIAGNOSIS — R319 Hematuria, unspecified: Secondary | ICD-10-CM | POA: Diagnosis present

## 2015-05-16 DIAGNOSIS — Z79899 Other long term (current) drug therapy: Secondary | ICD-10-CM | POA: Diagnosis not present

## 2015-05-16 DIAGNOSIS — L8990 Pressure ulcer of unspecified site, unspecified stage: Secondary | ICD-10-CM | POA: Insufficient documentation

## 2015-05-16 DIAGNOSIS — Z86718 Personal history of other venous thrombosis and embolism: Secondary | ICD-10-CM

## 2015-05-16 DIAGNOSIS — F329 Major depressive disorder, single episode, unspecified: Secondary | ICD-10-CM | POA: Diagnosis present

## 2015-05-16 HISTORY — DX: Acute embolism and thrombosis of unspecified deep veins of unspecified lower extremity: I82.409

## 2015-05-16 HISTORY — DX: Gastro-esophageal reflux disease without esophagitis: K21.9

## 2015-05-16 LAB — CBC WITH DIFFERENTIAL/PLATELET
BASOS ABS: 0 10*3/uL (ref 0.0–0.1)
Basophils Relative: 0 % (ref 0–1)
Eosinophils Absolute: 0.1 10*3/uL (ref 0.0–0.7)
Eosinophils Relative: 1 % (ref 0–5)
HCT: 23.8 % — ABNORMAL LOW (ref 36.0–46.0)
Hemoglobin: 6.8 g/dL — CL (ref 12.0–15.0)
Lymphocytes Relative: 16 % (ref 12–46)
Lymphs Abs: 1.4 10*3/uL (ref 0.7–4.0)
MCH: 19.9 pg — ABNORMAL LOW (ref 26.0–34.0)
MCHC: 28.6 g/dL — ABNORMAL LOW (ref 30.0–36.0)
MCV: 69.6 fL — ABNORMAL LOW (ref 78.0–100.0)
MONO ABS: 0.8 10*3/uL (ref 0.1–1.0)
MONOS PCT: 9 % (ref 3–12)
NEUTROS PCT: 74 % (ref 43–77)
Neutro Abs: 6.2 10*3/uL (ref 1.7–7.7)
PLATELETS: 377 10*3/uL (ref 150–400)
RBC: 3.42 MIL/uL — ABNORMAL LOW (ref 3.87–5.11)
RDW: 24.7 % — AB (ref 11.5–15.5)
WBC: 8.5 10*3/uL (ref 4.0–10.5)

## 2015-05-16 LAB — COMPREHENSIVE METABOLIC PANEL
ALK PHOS: 104 U/L (ref 38–126)
ALT: 7 U/L — ABNORMAL LOW (ref 14–54)
AST: 17 U/L (ref 15–41)
Albumin: 2.2 g/dL — ABNORMAL LOW (ref 3.5–5.0)
Anion gap: 8 (ref 5–15)
BILIRUBIN TOTAL: 0.6 mg/dL (ref 0.3–1.2)
BUN: 8 mg/dL (ref 6–20)
CHLORIDE: 99 mmol/L — AB (ref 101–111)
CO2: 27 mmol/L (ref 22–32)
CREATININE: 0.45 mg/dL (ref 0.44–1.00)
Calcium: 7.9 mg/dL — ABNORMAL LOW (ref 8.9–10.3)
GFR calc non Af Amer: >60 mL/min (ref >60)
GLUCOSE: 89 mg/dL (ref 65–99)
Potassium: 3.2 mmol/L — ABNORMAL LOW (ref 3.5–5.1)
Sodium: 134 mmol/L — ABNORMAL LOW (ref 135–145)
Total Protein: 6 g/dL — ABNORMAL LOW (ref 6.5–8.1)

## 2015-05-16 LAB — URINALYSIS, ROUTINE W REFLEX MICROSCOPIC
BILIRUBIN URINE: NEGATIVE
Glucose, UA: NEGATIVE mg/dL
Hgb urine dipstick: NEGATIVE
Ketones, ur: NEGATIVE mg/dL
Nitrite: NEGATIVE
Protein, ur: NEGATIVE mg/dL
SPECIFIC GRAVITY, URINE: 1.016 (ref 1.005–1.030)
Urobilinogen, UA: 1 mg/dL (ref 0.0–1.0)
pH: 6.5 (ref 5.0–8.0)

## 2015-05-16 LAB — POC OCCULT BLOOD, ED: Fecal Occult Bld: POSITIVE — AB

## 2015-05-16 LAB — URINE MICROSCOPIC-ADD ON

## 2015-05-16 LAB — APTT: aPTT: 43 seconds — ABNORMAL HIGH (ref 24–37)

## 2015-05-16 LAB — PROTIME-INR
INR: 1.24 (ref 0.00–1.49)
Prothrombin Time: 15.8 seconds — ABNORMAL HIGH (ref 11.6–15.2)

## 2015-05-16 LAB — OCCULT BLOOD X 1 CARD TO LAB, STOOL: Fecal Occult Bld: POSITIVE — AB

## 2015-05-16 LAB — PREPARE RBC (CROSSMATCH)

## 2015-05-16 MED ORDER — SODIUM CHLORIDE 0.9 % IV SOLN
Freq: Once | INTRAVENOUS | Status: AC
  Administered 2015-05-16: 75 mL/h via INTRAVENOUS

## 2015-05-16 MED ORDER — SODIUM CHLORIDE 0.9 % IV BOLUS (SEPSIS)
500.0000 mL | Freq: Once | INTRAVENOUS | Status: AC
  Administered 2015-05-16: 500 mL via INTRAVENOUS

## 2015-05-16 MED ORDER — NICOTINE 21 MG/24HR TD PT24
21.0000 mg | MEDICATED_PATCH | Freq: Every day | TRANSDERMAL | Status: DC
  Filled 2015-05-16: qty 1

## 2015-05-16 MED ORDER — CEFTRIAXONE SODIUM 1 G IJ SOLR
1.0000 g | Freq: Once | INTRAMUSCULAR | Status: AC
  Administered 2015-05-16: 1 g via INTRAVENOUS
  Filled 2015-05-16: qty 10

## 2015-05-16 MED ORDER — PANTOPRAZOLE SODIUM 40 MG PO TBEC
40.0000 mg | DELAYED_RELEASE_TABLET | Freq: Every day | ORAL | Status: DC
  Administered 2015-05-17: 40 mg via ORAL
  Filled 2015-05-16 (×3): qty 1

## 2015-05-16 MED ORDER — SODIUM CHLORIDE 0.9 % IJ SOLN
3.0000 mL | Freq: Two times a day (BID) | INTRAMUSCULAR | Status: DC
  Administered 2015-05-16 – 2015-05-18 (×4): 3 mL via INTRAVENOUS

## 2015-05-16 MED ORDER — CEFTRIAXONE SODIUM IN DEXTROSE 20 MG/ML IV SOLN
1.0000 g | INTRAVENOUS | Status: DC
  Administered 2015-05-17: 1 g via INTRAVENOUS
  Filled 2015-05-16 (×2): qty 50

## 2015-05-16 MED ORDER — FAMOTIDINE IN NACL 20-0.9 MG/50ML-% IV SOLN
20.0000 mg | INTRAVENOUS | Status: AC
  Administered 2015-05-16: 20 mg via INTRAVENOUS
  Filled 2015-05-16: qty 50

## 2015-05-16 MED ORDER — PANTOPRAZOLE SODIUM 40 MG IV SOLR
40.0000 mg | INTRAVENOUS | Status: AC
  Administered 2015-05-16: 40 mg via INTRAVENOUS
  Filled 2015-05-16: qty 40

## 2015-05-16 MED ORDER — LORAZEPAM 2 MG/ML IJ SOLN
0.5000 mg | Freq: Once | INTRAMUSCULAR | Status: AC
  Administered 2015-05-16: 0.5 mg via INTRAVENOUS
  Filled 2015-05-16: qty 1

## 2015-05-16 MED ORDER — SODIUM CHLORIDE 0.9 % IV SOLN
Freq: Once | INTRAVENOUS | Status: AC
  Administered 2015-05-16: 15:00:00 via INTRAVENOUS

## 2015-05-16 MED ORDER — MORPHINE SULFATE 2 MG/ML IJ SOLN
2.0000 mg | INTRAMUSCULAR | Status: DC | PRN
  Administered 2015-05-16 – 2015-05-17 (×4): 2 mg via INTRAVENOUS
  Filled 2015-05-16 (×6): qty 1

## 2015-05-16 MED ORDER — ONDANSETRON HCL 4 MG PO TABS: 4.0000 mg | ORAL_TABLET | Freq: Four times a day (QID) | ORAL | Status: DC | PRN

## 2015-05-16 MED ORDER — ONDANSETRON HCL 4 MG/2ML IJ SOLN: 4.0000 mg | Freq: Four times a day (QID) | INTRAMUSCULAR | Status: DC | PRN

## 2015-05-16 MED ORDER — DONEPEZIL HCL 5 MG PO TABS
5.0000 mg | ORAL_TABLET | Freq: Every morning | ORAL | Status: DC
  Administered 2015-05-17 – 2015-05-18 (×2): 5 mg via ORAL
  Filled 2015-05-16 (×3): qty 1

## 2015-05-16 NOTE — ED Notes (Signed)
Spoke with Irving BurtonEmily, charge RN 4th floor. Aware of pt arrival.

## 2015-05-16 NOTE — H&P (Signed)
[facility] History and Physical  Ann Kelley ZOX:[id number] DOB: 1926/03/01 DOA: 05/16/2015  Referring physician: ED physician Dr. Eber Hong  PCP: Tommie Raymond, MD   Chief Complaint: lethargy   HPI:  81 year old female with a history of dementia, diagnosed with PE in August 2015 and has been on Eliquis since, now presented to [facility] ED for evaluation of progressive lethargy and poor oral intake. Please note that pt is unable to provide any history at the time of the admission due to dementia and there is no family at bedside. Most of the information obtained from ED doctor, Dr. Hyacinth Meeker and available ED records. Pt apparently lives at home with daughter who has noted progressive failure to thrive. Pt is also apparently bed bound and has not been out of the bed in over two years and has had some decub ulcers. Pt has not seen physician in also several years. Per record review, there is a report of pain in bilateral LE but pt currently denies any pain and refused to be touched (thinks we have broke into her house and wants [place] to leave). No reported fevers, chills, no reported urinary or abd concerns.   In ED, VSS but noted to be confused, noted to have blood in urine and stool, FOBT + for blood, blood work notable for Hg 6.8 (at baseline Hg ~ 10). [facility] asked to admit for further evaluation.   Assessment and Plan: Active Problems: Acute on chronic encephalopathy - appears to be secondary to progressive dementia, ? UTI, acute blood loss anemia - admit to telemetry bed - place on empiric ABX Rocephin for presumptive UTI and follow up on urine culture - also transfuse 2 U of PRBC - will need PT evaluation once more medically stable   Acute blood loss anemia imposed on anemia of IDA, microcytic  - FOBT + - transfuse 2 U PRBC - anemia panel requested - hold Eliquis - consider GI consult to determine if [facility] should be continued   History of PE - will hold Eliquis for now - monitor on  telemetry   Hypokalemia - supplement and repeat BMP in AM  Dementia - continue home medical regimen with Aricept   Right heel DTPI, left lateral LE wound (full thickness) - wound care team consulted, appreciate assistance   HTN - hold losartan - HCTZ as SBP is 111 at the time of the admission  Cachectic and underweight  - Body mass index is 17.86 kg/(m^2).  Radiological Exams on Admission: No results found.   Code Status: Full Family Communication: No family at bedside  Disposition Plan: Admit for further evaluation    Danie Binder [facility] [phone]   Review of Systems:  Unable to obtain due to dementia     Past Medical History  Diagnosis Date  . Hypertension   . Dementia   . GERD (gastroesophageal reflux disease)   . DVT (deep venous thrombosis)     Past Surgical History  Procedure Laterality Date  . Appendectomy    . Abdominal hysterectomy      Social History:  reports that she has been smoking Cigarettes.  She has a 22.5 pack-year smoking history. She does not have any smokeless tobacco history on file. She reports that she does not drink alcohol or use illicit drugs.  No Known Allergies  Family History  Problem Relation Age of Onset  . Cancer Mother   . Heart failure Father     Medication Sig  apixaban (ELIQUIS) 5 MG  TABS tablet Take 1 tablet (5 mg total) by mouth 2 (two) times daily. Patient taking differently: Take 5 mg by mouth daily.   donepezil (ARICEPT) 5 MG tablet Take 5 mg by mouth every morning.   pantoprazole (PROTONIX) 40 MG tablet Take 1 tablet (40 mg total) by mouth daily at 12 noon.  valsartan-hydrochlorothiazide (DIOVAN-HCT) 320-12.5 MG per tablet Take 1 tablet by mouth daily.   HYDROcodone-acetaminophen (NORCO) 5-325 MG per tablet Take 1 tablet by mouth every 8 (eight) hours as needed for severe pain. Patient not taking: Reported on 05/16/2015   Physical Exam: Filed Vitals:   05/16/15 1045 05/16/15 1108  BP: 109/65   Pulse: 76    Temp: 98.5 F (36.9 C)   Resp: 20   SpO2: 98% 98%    Physical Exam  Constitutional: Appears confused  HENT: Normocephalic. External right and left ear normal. Dry MM Eyes: Conjunctivae and EOM are normal. PERRLA, no scleral icterus.  Neck: Normal ROM. Neck supple. No JVD. No tracheal deviation. No thyromegaly.  CVS: RRR, S1/S2 +, no gallops, no carotid bruit.  Pulmonary: Effort and breath sounds normal, no stridor, diminished breath sounds at bases  Abdominal: Soft. BS +,  no distension, tenderness, rebound or guarding.  Musculoskeletal: pt refusing to have LE examined  Lymphadenopathy: No lymphadenopathy noted, cervical, inguinal. Neuro: Alert. No cranial nerve deficit.Confused but able to move upper extremities, refused to move LE Skin: Skin is warm and dry.  Psychiatric: difficult to assess due to dementia and confusion   Labs on Admission:  Basic Metabolic Panel:  Recent Labs Lab 05/16/15 1228  NA 134*  K 3.2*  CL 99*  CO2 27  GLUCOSE 89  BUN 8  CREATININE 0.45  CALCIUM 7.9*   Liver Function Tests:  Recent Labs Lab 05/16/15 1228  AST 17  ALT 7*  ALKPHOS 104  BILITOT 0.6  PROT 6.0*  ALBUMIN 2.2*   CBC:  Recent Labs Lab 05/16/15 1228  WBC 8.5  NEUTROABS PENDING  HGB 6.8*  HCT 23.8*  MCV 69.6*  PLT 377   EKG: pending   If 7PM-7AM, please contact night-coverage [email or web address] Password [email or web address] 05/16/2015, 1:27 PM

## 2015-05-16 NOTE — ED Notes (Signed)
Spoke with [employer]. Stated should be here in around 2 hours. MD and RN notified.

## 2015-05-16 NOTE — ED Provider Notes (Signed)
CSN: [id number]     Arrival date & time 05/16/15  1035 History   First MD Initiated Contact with Patient 05/16/15 1047     Chief Complaint  Patient presents with  . Leg Pain     (Consider location/radiation/quality/duration/timing/severity/associated sxs/prior Treatment) HPI Comments: The patient is an 81 year old female with a history of dementia, level V caveat applies as the patient is not able to give any valuable information. Her daughter is the primary caregiver, the daughter lives with her mother, she states that she has not been out of bed in 2 years, she has not seen a physician in more than that amount of time, she has had approximately 4 days of decreased appetite, foul-smelling wound she has been drinking ensure shakes to help with some nutrition. She has been complaining of increased pain in her bilateral legs, she does not want anyone to touch her, she seems more agitated than usual according to the daughter. There has been no fevers vomiting or complaints of abdominal pain or dysuria though she does have dark red blood in her stools the last couple of days and is on a blood thinner after being diagnosed with DVT and pulmonary embolism in the past.  Review of the medical record shows that the patient was admitted to the hospital in August 2015 with a pulmonary embolism, she has been on Eliquis since that time.   The history is provided by the patient and a relative.    Past Medical History  Diagnosis Date  . Hypertension   . Dementia   . GERD (gastroesophageal reflux disease)   . DVT (deep venous thrombosis)    Past Surgical History  Procedure Laterality Date  . Appendectomy    . Abdominal hysterectomy     Family History  Problem Relation Age of Onset  . Cancer Mother   . Heart failure Father    History  Substance Use Topics  . Smoking status: Current Every Day Smoker -- 1.50 packs/day for 15 years    Types: Cigarettes  . Smokeless tobacco: Not on file  . Alcohol  Use: No   OB History    No data available     Review of Systems  Unable to perform ROS: Dementia      Allergies  Review of patient's allergies indicates no known allergies.  Home Medications   Prior to Admission medications   Medication Sig Start Date End Date Taking? Authorizing Provider  acetaminophen (TYLENOL) 500 MG tablet Take 1,000 mg by mouth every 6 (six) hours as needed for moderate pain.   Yes Historical Provider, MD  apixaban (ELIQUIS) 5 MG TABS tablet Take 1 tablet (5 mg total) by mouth 2 (two) times daily. Patient taking differently: Take 5 mg by mouth daily.  06/16/14  Yes Vassie Lollarlos Madera, MD  donepezil (ARICEPT) 5 MG tablet Take 5 mg by mouth every morning.  05/12/14  Yes Historical Provider, MD  Multiple Vitamin (MULTIVITAMIN WITH MINERALS) TABS tablet Take 1 tablet by mouth daily.   Yes Historical Provider, MD  pantoprazole (PROTONIX) 40 MG tablet Take 1 tablet (40 mg total) by mouth daily at 12 noon. 06/09/14  Yes Vassie Lollarlos Madera, MD  valsartan-hydrochlorothiazide (DIOVAN-HCT) 320-12.5 MG per tablet Take 1 tablet by mouth daily.  05/11/14  Yes Historical Provider, MD  apixaban (ELIQUIS) 5 MG TABS tablet Take 2 tablets (10 mg total) by mouth 2 (two) times daily. Patient taking differently: Take 5 mg by mouth daily.  06/09/14 06/15/14  Vassie Lollarlos Madera, MD  HYDROcodone-acetaminophen (NORCO) 5-325 MG per tablet Take 1 tablet by mouth every 8 (eight) hours as needed for severe pain. Patient not taking: Reported on 05/16/2015 06/09/14   Vassie Loll, MD   BP 121/97 mmHg  Pulse 92  Temp(Src) 98.1 F (36.7 C) (Axillary)  Resp 18  Ht 5\' 5"  (1.651 m)  Wt 107 lb 4.8 oz (48.671 kg)  BMI 17.86 kg/m2  SpO2 100% Physical Exam  Constitutional: She appears well-developed and well-nourished. No distress.  HENT:  Head: Normocephalic and atraumatic.  Mouth/Throat: Oropharynx is clear and moist. No oropharyngeal exudate.  Eyes: Conjunctivae and EOM are normal. Pupils are equal, round, and  reactive to light. Right eye exhibits no discharge. Left eye exhibits no discharge. No scleral icterus.  Neck: Normal range of motion. Neck supple. No JVD present. No thyromegaly present.  Cardiovascular: Normal rate, regular rhythm, normal heart sounds and intact distal pulses.  Exam reveals no gallop and no friction rub.   No murmur heard. Pulmonary/Chest: Effort normal and breath sounds normal. No respiratory distress. She has no wheezes. She has no rales.  Abdominal: Soft. Bowel sounds are normal. She exhibits no distension and no mass. There is no tenderness.  Genitourinary:  Chaperone present for exam, external hemorrhoids are nontender and nonthrombosed, no fissure, mildly incontinent of stool, internal exam without masses or obvious tenderness, voluminous amount of soft bloody stool in the rectal vault  Musculoskeletal: Normal range of motion. She exhibits edema. She exhibits no tenderness.  Left lower extremity with a symmetrical pitting edema compared to the right lower extremity. Open draining pressure ulcer just distal to the left knee on the lateral aspect of the leg, dependent edema in the left thigh, stage I decubitus ulcer of the left anterior thigh at the pelvis as well as over the sacrum  Lymphadenopathy:    She has no cervical adenopathy.  Neurological: She is alert. Coordination normal.  Skin: Skin is warm and dry.  Foul-smelling drainage from the left knee decubitus  Psychiatric: She has a normal mood and affect. Her behavior is normal.  Nursing note and vitals reviewed.   ED Course  Procedures (including critical care time) Labs Review Labs Reviewed  CBC WITH DIFFERENTIAL/PLATELET - Abnormal; Notable for the following:    RBC 3.42 (*)    Hemoglobin 6.8 (*)    HCT 23.8 (*)    MCV 69.6 (*)    MCH 19.9 (*)    MCHC 28.6 (*)    RDW 24.7 (*)    All other components within normal limits  COMPREHENSIVE METABOLIC PANEL - Abnormal; Notable for the following:    Sodium 134  (*)    Potassium 3.2 (*)    Chloride 99 (*)    Calcium 7.9 (*)    Total Protein 6.0 (*)    Albumin 2.2 (*)    ALT 7 (*)    All other components within normal limits  APTT - Abnormal; Notable for the following:    aPTT 43 (*)    All other components within normal limits  PROTIME-INR - Abnormal; Notable for the following:    Prothrombin Time 15.8 (*)    All other components within normal limits  URINALYSIS, ROUTINE W REFLEX MICROSCOPIC (NOT AT [facility]) - Abnormal; Notable for the following:    APPearance CLOUDY (*)    Leukocytes, UA MODERATE (*)    All other components within normal limits  OCCULT BLOOD X 1 CARD TO LAB, STOOL - Abnormal; Notable for the following:  Fecal Occult Bld POSITIVE (*)    All other components within normal limits  URINE MICROSCOPIC-ADD ON - Abnormal; Notable for the following:    Squamous Epithelial / LPF MANY (*)    Bacteria, UA MANY (*)    All other components within normal limits  POC OCCULT BLOOD, ED - Abnormal; Notable for the following:    Fecal Occult Bld POSITIVE (*)    All other components within normal limits  URINE CULTURE  URINE CULTURE  URINALYSIS, ROUTINE W REFLEX MICROSCOPIC (NOT AT [facility])  OCCULT BLOOD X 1 CARD TO LAB, STOOL  TYPE AND SCREEN  PREPARE RBC (CROSSMATCH)    Imaging Review No results found.    MDM   Final diagnoses:  Swelling    Vital signs are unremarkable, the patient appears to have an infected decubitus ulcer at the lower extremity on the left, the other ulcers are superficial and do not appear infected. She has a general decline in her status including her appetite, we'll rule out urinary infection or metabolic abnormalities, she has blood in her stools, she is on a blood thinner, she is high risk for worsening gastrointestinal bleeding and decompensation.  The patient has multiple lab abnormalities including a severe anemia for which she is requiring blood transfusion, hyponatremia, hypokalemia, hypoalbuminemia  which may be a source of her swelling as well. I believe that the majority of her swelling is related to dependent edema, she has an open sore on her leg which could be the source of the foul smell emanating from her leg, and the buttocks have been ordered to cover for both the urinary tract infection and a sore, discussed care with the hospitalist who will admit.  Eber Hong, MD 05/16/15 [phone]

## 2015-05-16 NOTE — Progress Notes (Signed)
VASCULAR LAB PRELIMINARY  PRELIMINARY  PRELIMINARY  PRELIMINARY  Bilateral lower extremity venous duplex  completed.    Preliminary report:  Right:  No evidence of DVT, superficial thrombosis, or Baker's cyst.  Left:  No evidence of DVT in the imaged veins.  Popliteal vein not imaged due to bandage.  No superficial thrombosis or Baker's cyst.  Aleesa Sweigert, RVT 05/16/2015, 12:31 PM

## 2015-05-16 NOTE — ED Notes (Signed)
Awake. Verbally responsive. A/O x4. Resp even and unlabored. No audible adventitious breath sounds noted. ABC's intact. SR on monitor. IV infusing NS at 975ml/hr without difficulty. Family at bedside.

## 2015-05-16 NOTE — ED Notes (Signed)
Awake. Verbally responsive. A/O x4. Resp even and unlabored. No audible adventitious breath sounds noted. ABC's intact. IV patent and intact. Infusing medication per order without difficulty.

## 2015-05-16 NOTE — ED Notes (Signed)
US at bedside

## 2015-05-16 NOTE — ED Notes (Signed)
Bed: WA11 Expected date:  Expected time:  Means of arrival:  Comments: EMS 

## 2015-05-16 NOTE — ED Notes (Signed)
Attempted IV venipuncture x1 via Darl PikesSusan, RN was unsuccessful.

## 2015-05-16 NOTE — Consult Note (Signed)
WOC wound consult note Reason for Consult: Right  heel DTPI, left lateral LE wound (full thickness), moisture associated skin damage (MASD), specifically incontinence associated dermatitis (IAD) Wound type:Pressure, moisture Pressure Ulcer POA: Yes Measurement:  Right heel:  4cm x 5cm area of purple/maroon discoloration consistent with DTPI.  Left lateral LE full thickness ulcer (chronic, improving, according to daughter) 2.5cm x 2cm x0.4cm with 60% pink, 40% yellow movable slough.  Sacrum:  Healed area of IAD with scattered partial thickness skin breakdown due to friction measuring 4cm x 5cm. Wound ZOX:WRUEAbed:Right heel as described above.  Left LE as described above.  Sacrum as described above. Drainage (amount, consistency, odor) Scant serous from left lateral LE only Periwound:Bilateral LEs with edema, no erythema or warmth.  Daughter shown right heel and did not notice this in the past.   Dressing procedure/placement/frequency:A therapeutic mattress with low air loss feature is provided as are bilateral pressure redistribution boots.  A conservative POC for the right heel and left lateral LE are in place for Nursing. WOC nursing team will not follow, but will remain available to this patient, the nursing and medical teams.  Please re-consult if needed. Thanks, Ladona MowLaurie Kingslee Mairena, MSN, RN, GNP, [place], CWON-AP [phone])

## 2015-05-16 NOTE — ED Notes (Addendum)
Pt arrived via EMS with report of pt c/o lt outer knee pain and bright red blood in stool for 2-3 weeks. Pt was noted on dirty mattress without sheet and beer cans scattered around room. Noted Stg III to lt outer knee with dirty dsg intact and foul odor, Stg II to lt anterior hip area, Stg II to buttocks and unstg to lt heel. Dr. Hyacinth MeekerMiller at bedside.

## 2015-05-17 DIAGNOSIS — G9340 Encephalopathy, unspecified: Principal | ICD-10-CM

## 2015-05-17 DIAGNOSIS — L8990 Pressure ulcer of unspecified site, unspecified stage: Secondary | ICD-10-CM | POA: Insufficient documentation

## 2015-05-17 DIAGNOSIS — E43 Unspecified severe protein-calorie malnutrition: Secondary | ICD-10-CM

## 2015-05-17 DIAGNOSIS — D62 Acute posthemorrhagic anemia: Secondary | ICD-10-CM

## 2015-05-17 DIAGNOSIS — E876 Hypokalemia: Secondary | ICD-10-CM

## 2015-05-17 LAB — BASIC METABOLIC PANEL
Anion gap: 6 (ref 5–15)
BUN: 6 mg/dL (ref 6–20)
CO2: 26 mmol/L (ref 22–32)
Calcium: 7.8 mg/dL — ABNORMAL LOW (ref 8.9–10.3)
Chloride: 102 mmol/L (ref 101–111)
Creatinine, Ser: 0.48 mg/dL (ref 0.44–1.00)
GFR calc non Af Amer: >60 mL/min (ref >60)
Glucose, Bld: 76 mg/dL (ref 65–99)
Potassium: 3.2 mmol/L — ABNORMAL LOW (ref 3.5–5.1)
Sodium: 134 mmol/L — ABNORMAL LOW (ref 135–145)

## 2015-05-17 LAB — RETICULOCYTES
RBC.: 4.38 MIL/uL (ref 3.87–5.11)
Retic Count, Absolute: 92 10*3/uL (ref 19.0–186.0)
Retic Ct Pct: 2.1 % (ref 0.4–3.1)

## 2015-05-17 LAB — CBC
HEMATOCRIT: 33.5 % — AB (ref 36.0–46.0)
Hemoglobin: 10.5 g/dL — ABNORMAL LOW (ref 12.0–15.0)
MCH: 24 pg — AB (ref 26.0–34.0)
MCHC: 31.3 g/dL (ref 30.0–36.0)
MCV: 76.5 fL — ABNORMAL LOW (ref 78.0–100.0)
Platelets: 218 10*3/uL (ref 150–400)
RBC: 4.38 MIL/uL (ref 3.87–5.11)
RDW: 24.1 % — ABNORMAL HIGH (ref 11.5–15.5)
WBC: 9 10*3/uL (ref 4.0–10.5)

## 2015-05-17 LAB — TYPE AND SCREEN
ABO/RH(D): O POS
Antibody Screen: NEGATIVE
UNIT DIVISION: 0
Unit division: 0

## 2015-05-17 LAB — URINE CULTURE
Culture: 6000
Special Requests: NORMAL

## 2015-05-17 LAB — FOLATE: Folate: 10.3 ng/mL (ref >5.9)

## 2015-05-17 LAB — IRON AND TIBC
Iron: 211 ug/dL — ABNORMAL HIGH (ref 28–170)
Saturation Ratios: 88 % — ABNORMAL HIGH (ref 10.4–31.8)
TIBC: 239 ug/dL — AB (ref 250–450)
UIBC: 28 ug/dL

## 2015-05-17 LAB — VITAMIN B12: VITAMIN B 12: 1501 pg/mL — AB (ref 180–914)

## 2015-05-17 LAB — FERRITIN: Ferritin: 9 ng/mL — ABNORMAL LOW (ref 11–307)

## 2015-05-17 MED ORDER — VALSARTAN-HYDROCHLOROTHIAZIDE 320-12.5 MG PO TABS: 1.0000 | ORAL_TABLET | Freq: Every day | ORAL | Status: DC

## 2015-05-17 MED ORDER — POTASSIUM CHLORIDE CRYS ER 20 MEQ PO TBCR
40.0000 meq | EXTENDED_RELEASE_TABLET | Freq: Once | ORAL | Status: AC
  Administered 2015-05-17: 40 meq via ORAL
  Filled 2015-05-17: qty 2

## 2015-05-17 MED ORDER — IRBESARTAN 300 MG PO TABS
300.0000 mg | ORAL_TABLET | Freq: Every day | ORAL | Status: DC
  Administered 2015-05-17 – 2015-05-18 (×2): 300 mg via ORAL
  Filled 2015-05-17 (×2): qty 1

## 2015-05-17 MED ORDER — HYDROCHLOROTHIAZIDE 12.5 MG PO CAPS
12.5000 mg | ORAL_CAPSULE | Freq: Every day | ORAL | Status: DC
  Administered 2015-05-17 – 2015-05-18 (×2): 12.5 mg via ORAL
  Filled 2015-05-17 (×2): qty 1

## 2015-05-17 MED ORDER — ENSURE ENLIVE PO LIQD
237.0000 mL | Freq: Three times a day (TID) | ORAL | Status: DC
  Administered 2015-05-17 – 2015-05-18 (×3): 237 mL via ORAL

## 2015-05-17 NOTE — Care Management Note (Signed)
Case Management Note  Patient Details  Name: Ann Kelley MRN: [id number] Date of Birth: 04/26/1926  Subjective/Objective:   81 y/o f admitted w/Acute on chronic encephalopathy.ON:[id number]:dementia.From home w/children.Has hospital bed.Spoke to dtrs in rm about d/c plans-home w/HHRN. TC [facility] rep Kristen aware of HHC order.Asked about air mattress-Will check w/[facility] dme rep Lecretia-await response.Will need ambulance transp home.                  Action/Plan:d/c plan [facility].    Expected Discharge Date:  05/19/15               Expected Discharge Plan:  Home w Home Health Services  In-House Referral:  Clinical Social Work  Discharge planning Services  CM Consult  Post Acute Care Choice:  [facility]urable Medical [facility] Choice offered to:  Adult Children  DME Arranged:    DME Agency:     HH Arranged:  RN HH Agency:  [facility]  Status of Service:  In process, will continue to follow  Medicare Important Message Given:  Yes-second notification given Date Medicare IM Given:    Medicare IM give by:    Date Additional Medicare IM Given:    Additional Medicare Important Message give by:     If discussed at Long Length of Stay Meetings, dates discussed:    Additional Comments:  Lanier ClamMahabir, Jahnia Hewes, RN 05/17/2015, 3:29 PM

## 2015-05-17 NOTE — Progress Notes (Addendum)
Patient ID: Ann Kelley, female   DOB: 1926-10-31, 81 y.o.   MRN: [id number] [facility] PROGRESS NOTE  Elynn Patteson FAO:[id number] DOB: 04/18/1926 DOA: 05/16/2015 PCP: Tommie Raymond, MD  Brief narrative:    81 year old female with a history of dementia, diagnosed with PE in August 2015 and has been on Eliquis since, per family report patient has been severely depressed ever since her husband has passed away and has been bedbound. Patient presented to [facility] long ED for evaluation of lethargy, poor by mouth intake. In ED, patient was noted to have blood in the urine and stool. FOBT was positive for blood. Her hemoglobin was 6.8 on the admission. She has received 2 units of PRBC so far.  Barrier to discharge: Her hemoglobin is stable posttransfusion. We will repeat CBC tomorrow morning. Also, awaiting physical therapy evaluation for safe discharge plan.   Assessment/Plan:    Principal problem: Adult failure to thrive / generalized weakness / dehydration - Progressive failure to thrive with generalized weakness likely secondary to patient's history of depression. - Patient's daughter at the bedside reports that patient has been depressed but currently patient is confused and seems to have more underlying dementia than depression. - Will provide supportive care with fluids for hydration - Obtain nutrition and physical therapy consult  Active Problems: Acute on chronic encephalopathy / dementia - Progressive dementia. Stable mental status and maybe a little better this morning. - Continue Aricept   Acute blood loss anemia imposed on anemia of IDA, microcytic  - FOBT +. Likely triggered by apixaban which was placed on hold at the time of admission. - Patient has received 2 units of PRBC during this hospital stay.  - Post transfusion hemoglobin is 10.5. - Follow-up CBC daily     UTI - UA on admission showed moderate leukocytes - Continue empiric rocephin - Follow up urine  culture results   History of PE - Continue to hold Eliquis for now  Hypokalemia - Likely from BP med valsartan-hctz - Supplemented   Right heel DTPI, left lateral LE wound (full thickness) - Wound care consulted. Appreciate their assessment. - Right heel DTPI, left lateral LE wound (full thickness), moisture associated skin damage (MASD), specifically incontinence associated dermatitis (IAD). Measurement: Right heel: 4cm x 5cm area of purple/maroon discoloration consistent with DTPI. Left lateral LE full thickness ulcer (chronic, improving) 2.5cm x 2cm x0.4cm . Sacrum: Healed area of IAD with scattered partial thickness skin breakdown due to friction measuring 4cm x 5cm. Dressing procedure/placement/frequency: A therapeutic mattress with low air loss feature is provided as are bilateral pressure redistribution boots. A conservative POC for the right heel and left lateral LE are in place.  Essential hypertension - Blood pressure medication on hold because of soft blood pressure on the admission. - Blood pressure is 146/83 this morning so will resume losartan-HCTZ  Cachectic and underweight  / Severe protein calorie malnutrition  - Body mass index is 17.86 kg/(m^2). - Nutrition consulted   DVT Prophylaxis  - SCD's bilaterally due to risk of bleeding    Code Status: Full.  Family Communication:  plan of care discussed with the patient's daughter at the bedside  Disposition Plan: needs PT evaluation for safe discharge plan  IV access:  Peripheral IV  Procedures and diagnostic studies:    No results found.  Medical Consultants:  None   Other Consultants:  WOC Physical therapy  IAnti-Infectives:   Rocephin 05/16/2015 -->    Jamerson Vonbargen, MD  [facility]  Pager [phone]  Time spent in minutes: 25 minutes  If 7PM-7AM, please contact night-coverage [email or web address] Password [email or web address] 05/17/2015, 11:54 AM   LOS: 1 day    HPI/Subjective: No acute overnight  events. No nausea or vomiting.   Objective: Filed Vitals:   05/16/15 2049 05/16/15 2105 05/16/15 2335 05/17/15 0452  BP: 139/93 122/69 143/77 146/83  Pulse: 86 77 84 88  Temp: 97.7 F (36.5 C) 97.5 F (36.4 C) 97.5 F (36.4 C) 97.5 F (36.4 C)  TempSrc: Axillary Axillary Axillary Axillary  Resp: 18 18 18 20   Height:      Weight:      SpO2: 100% 100% 98% 100%    Intake/Output Summary (Last 24 hours) at 05/17/15 1154 Last data filed at 05/17/15 0900  Gross per 24 hour  Intake    665 ml  Output      0 ml  Net    665 ml    Exam:   General:  Pt is alert, not in acute distress, disoriented   Cardiovascular: Rate controlled, (+) S1, S2  Respiratory: Clear to auscultation bilaterally, no wheezing, no crackles, no rhonchi  Abdomen: Soft, non tender, non distended, bowel sounds present  Extremities: pulses DP and PT palpable bilaterally  Neuro: Grossly nonfocal  Data Reviewed: Basic Metabolic Panel:  Recent Labs Lab 05/16/15 1228 05/17/15 0420  NA 134* 134*  K 3.2* 3.2*  CL 99* 102  CO2 27 26  GLUCOSE 89 76  BUN 8 6  CREATININE 0.45 0.48  CALCIUM 7.9* 7.8*   Liver Function Tests:  Recent Labs Lab 05/16/15 1228  AST 17  ALT 7*  ALKPHOS 104  BILITOT 0.6  PROT 6.0*  ALBUMIN 2.2*   No results for input(s): LIPASE, AMYLASE in the last 168 hours. No results for input(s): AMMONIA in the last 168 hours. CBC:  Recent Labs Lab 05/16/15 1228 05/17/15 0420  WBC 8.5 9.0  NEUTROABS 6.2  --   HGB 6.8* 10.5*  HCT 23.8* 33.5*  MCV 69.6* 76.5*  PLT 377 218   Cardiac Enzymes: No results for input(s): CKTOTAL, CKMB, CKMBINDEX, TROPONINI in the last 168 hours. BNP: Invalid input(s): POCBNP CBG: No results for input(s): GLUCAP in the last 168 hours.  No results found for this or any previous visit (from the past 240 hour(s)).   Scheduled Meds: . cefTRIAXone (ROCEPHIN)  IV  1 g Intravenous Q24H  . donepezil  5 mg Oral q morning - 10a  . feeding  supplement (ENSURE ENLIVE)  237 mL Oral TID BM  . pantoprazole  40 mg Oral Q1200  . sodium chloride  3 mL Intravenous Q12H   Continuous Infusions:

## 2015-05-17 NOTE — Care Management (Signed)
Important Message  Patient Details  Name: Ann Kelley MRN: [id number] Date of Birth: 09/04/1926   Medicare Important Message Given:  Yes-second notification given    Haskell FlirtJamison, Kreig Parson 05/17/2015, 12:43 PM

## 2015-05-17 NOTE — Progress Notes (Signed)
PT Cancellation Note  Patient Details Name: Ann DonnaWillie Mae Kelley MRN: [id number] DOB: 07/12/1926   Cancelled Treatment:    Reason Eval/Treat Not Completed: PT screened, no needs identified, will sign off. Spoke with daughter who did not feel pt could benefit from PT. Pt is bed bound and hasn't been OOB in at least a year per daughter. Daughter states she feels she can continue to manage pt 's care at home. She would like to possibly have air matteress for hospital bed at home. Will sign off. Thanks.    Rebeca AlertJannie Keylie Beavers, MPT Pager: [phone]

## 2015-05-17 NOTE — Progress Notes (Signed)
Initial Nutrition Assessment  DOCUMENTATION CODES:  Underweight, Severe malnutrition in context of chronic illness  INTERVENTION: - Will order Ensure Enlive TID, each supplement provides 350 kcal and 20 grams of protein - RD will continue to monitor for needs  NUTRITION DIAGNOSIS:  Increased nutrient needs related to wound healing as evidenced by estimated needs.  GOAL:  Patient will meet greater than or equal to 90% of their needs  MONITOR:  PO intake, Supplement acceptance, Weight trends, Labs  REASON FOR ASSESSMENT:  Malnutrition Screening Tool, Consult Assessment of nutrition requirement/status  ASSESSMENT: 81 year old female with a history of dementia, diagnosed with PE in August 2015 and has been on Eliquis since, [facility] for evaluation of progressive lethargy and poor oral intake. Pt apparently lives at home with daughter who has noted progressive failure to thrive. Pt is also apparently bed bound and has not been out of the bed in over two years and has had some decub ulcers. Pt has not seen physician in also several years. Per record review, there is a report of pain in bilateral LE but pt currently denies any pain and refused to be touched.  Pt seen for consult and MST. BMI indicates underweight status. Pt with hx of dementia and sleeping at time of visit. She has been agitated, per notes, and physical assessment not performed but visualization indicates severe muscle wasting throughout upper body.   Daughter reports that pt has not been eating x1 week but that she was drinking Ensure. Daughter requests Ensure be ordered during hospitalization. She indicates that pt did not eat anything for breakfast and states pt seems afraid to swallow. She states pt once weighed 98 lbs and she estimates that pt currently weighs 130 lbs (weight record indicates pt weighs 107 lbs). Per limited weight hx review, pt has lost 20 lbs (16% body weight) in the past 11 months which  is not significant for time frame.  Not meeting needs. Medications reviewed. Labs reviewed; Na: 134 mmol/L, K: 3.2 mmol/L, Ca: 7.8 mg/dL.  Height:  Ht Readings from Last 1 Encounters:  05/16/15 5\' 5"  (1.651 m)    Weight:  Wt Readings from Last 1 Encounters:  05/16/15 107 lb 4.8 oz (48.671 kg)    Ideal Body Weight:  56.8 kg (kg)  Wt Readings from Last 10 Encounters:  05/16/15 107 lb 4.8 oz (48.671 kg)  06/07/14 127 lb 10.3 oz (57.9 kg)    BMI:  Body mass index is 17.86 kg/(m^2).  Estimated Nutritional Needs:  Kcal:  1450-1650  Protein:  75-85 grams  Fluid:  2.2-2.5 L/day  Skin:  Wound (see comment) (Stage 3 L leg, Stage 2 L hip and buttocks, DTI R heel)  Diet Order:  DIET SOFT Room service appropriate?: Yes; Fluid consistency:: Thin  EDUCATION NEEDS:  No education needs identified at this time   Intake/Output Summary (Last 24 hours) at 05/17/15 1146 Last data filed at 05/17/15 0900  Gross per 24 hour  Intake    665 ml  Output      0 ml  Net    665 ml    Last BM:  PTA     Trenton GammonJessica Mckale Haffey, RD, LDN Inpatient Clinical Dietitian Pager # [phone] After hours/weekend pager # [phone]

## 2015-05-18 DIAGNOSIS — E860 Dehydration: Secondary | ICD-10-CM | POA: Insufficient documentation

## 2015-05-18 DIAGNOSIS — D62 Acute posthemorrhagic anemia: Secondary | ICD-10-CM | POA: Insufficient documentation

## 2015-05-18 DIAGNOSIS — D509 Iron deficiency anemia, unspecified: Secondary | ICD-10-CM | POA: Insufficient documentation

## 2015-05-18 DIAGNOSIS — R531 Weakness: Secondary | ICD-10-CM

## 2015-05-18 LAB — BASIC METABOLIC PANEL
Anion gap: 8 (ref 5–15)
BUN: 7 mg/dL (ref 6–20)
CALCIUM: 8.2 mg/dL — AB (ref 8.9–10.3)
CHLORIDE: 102 mmol/L (ref 101–111)
CO2: 25 mmol/L (ref 22–32)
Creatinine, Ser: 0.41 mg/dL — ABNORMAL LOW (ref 0.44–1.00)
GFR calc non Af Amer: >60 mL/min (ref >60)
GLUCOSE: 109 mg/dL — AB (ref 65–99)
POTASSIUM: 3.5 mmol/L (ref 3.5–5.1)
Sodium: 135 mmol/L (ref 135–145)

## 2015-05-18 LAB — CBC
HCT: 34.7 % — ABNORMAL LOW (ref 36.0–46.0)
Hemoglobin: 10.5 g/dL — ABNORMAL LOW (ref 12.0–15.0)
MCH: 23.4 pg — ABNORMAL LOW (ref 26.0–34.0)
MCHC: 30.3 g/dL (ref 30.0–36.0)
MCV: 77.5 fL — ABNORMAL LOW (ref 78.0–100.0)
PLATELETS: 246 10*3/uL (ref 150–400)
RBC: 4.48 MIL/uL (ref 3.87–5.11)
RDW: 25.1 % — ABNORMAL HIGH (ref 11.5–15.5)
WBC: 9.6 10*3/uL (ref 4.0–10.5)

## 2015-05-18 MED ORDER — OXYCODONE-ACETAMINOPHEN 5-325 MG PO TABS: 1.0000 | ORAL_TABLET | ORAL | Status: DC | PRN

## 2015-05-18 MED ORDER — ENSURE ENLIVE PO LIQD: 237.0000 mL | Freq: Three times a day (TID) | ORAL | Status: AC

## 2015-05-18 MED ORDER — OXYCODONE-ACETAMINOPHEN 5-325 MG PO TABS
1.0000 | ORAL_TABLET | Freq: Once | ORAL | Status: AC
Start: 2015-05-18 — End: 2015-05-18
  Administered 2015-05-18: 1 via ORAL
  Filled 2015-05-18: qty 1

## 2015-05-18 MED ORDER — ACETAMINOPHEN 325 MG PO TABS
650.0000 mg | ORAL_TABLET | Freq: Once | ORAL | Status: AC
  Administered 2015-05-18: 650 mg via ORAL
  Filled 2015-05-18: qty 2

## 2015-05-18 NOTE — Discharge Instructions (Signed)
Anemia, Nonspecific Anemia is a condition in which the concentration of red blood cells or hemoglobin in the blood is below normal. Hemoglobin is a substance in red blood cells that carries oxygen to the tissues of the body. Anemia results in not enough oxygen reaching these tissues.  CAUSES  Common causes of anemia include:   Excessive bleeding. Bleeding may be internal or external. This includes excessive bleeding from periods (in women) or from the intestine.   Poor nutrition.   Chronic kidney, thyroid, and liver disease.  Bone marrow disorders that decrease red blood cell production.  Cancer and treatments for cancer.  HIV, AIDS, and their treatments.  Spleen problems that increase red blood cell destruction.  Blood disorders.  Excess destruction of red blood cells due to infection, medicines, and autoimmune disorders. SIGNS AND SYMPTOMS   Minor weakness.   Dizziness.   Headache.  Palpitations.   Shortness of breath, especially with exercise.   Paleness.  Cold sensitivity.  Indigestion.  Nausea.  Difficulty sleeping.  Difficulty concentrating. Symptoms may occur suddenly or they may develop slowly.  DIAGNOSIS  Additional blood tests are often needed. These help your health care provider determine the best treatment. Your health care provider will check your stool for blood and look for other causes of blood loss.  TREATMENT  Treatment varies depending on the cause of the anemia. Treatment can include:   Supplements of iron, vitamin B12, or folic acid.   Hormone medicines.   A blood transfusion. This may be needed if blood loss is severe.   Hospitalization. This may be needed if there is significant continual blood loss.   Dietary changes.  Spleen removal. HOME CARE INSTRUCTIONS Keep all follow-up appointments. It often takes many weeks to correct anemia, and having your health care provider check on your condition and your response to  treatment is very important. SEEK IMMEDIATE MEDICAL CARE IF:   You develop extreme weakness, shortness of breath, or chest pain.   You become dizzy or have trouble concentrating.  You develop heavy vaginal bleeding.   You develop a rash.   You have bloody or black, tarry stools.   You faint.   You vomit up blood.   You vomit repeatedly.   You have abdominal pain.  You have a fever or persistent symptoms for more than 2-3 days.   You have a fever and your symptoms suddenly get worse.   You are dehydrated.  MAKE SURE YOU:  Understand these instructions.  Will watch your condition.  Will get help right away if you are not doing well or get worse. Document Released: 11/30/2004 Document Revised: 06/25/2013 Document Reviewed: 04/18/2013 ExitCare Patient Information 2015 [employer]. This information is not intended to replace advice given to you by your health care provider. Make sure you discuss any questions you have with your health care provider.  

## 2015-05-18 NOTE — Care Management Note (Signed)
Case Management Note  Patient Details  Name: Ann Kelley MRN: [id number] Date of Birth: 12/09/1925  Subjective/Objective: [facility] rep aware of HHC orders, & dme-air mattress-deliver to home.D/c via ambulance.                   Action/Plan:No further d/c needs or orders.   Expected Discharge Date:  05/19/15               Expected Discharge Plan:  Home w Home Health Services  In-House Referral:  Clinical Social Work  Discharge planning Services  CM Consult  Post Acute Care Choice:  [facility]urable Medical [facility] ([facility] Choice offered to:  Adult Children  DME Arranged:  Specialty mattress DME Agency:  [facility]  HH Arranged:  RN, Nurse's Aide, Speech Therapy HH Agency:  [facility]  Status of Service:  Completed, signed off  Medicare Important Message Given:  Yes-second notification given Date Medicare IM Given:    Medicare IM give by:    Date Additional Medicare IM Given:    Additional Medicare Important Message give by:     If discussed at Long Length of Stay Meetings, dates discussed:    Additional Comments:  Lanier ClamMahabir, Halim Surrette, RN 05/18/2015, 12:27 PM

## 2015-05-18 NOTE — Progress Notes (Signed)
CSW consulted for transportation needs. Patient will need non-emergency ambulance transport home. CSW confirmed home address with patient's daughter, Dois DavenportSandra. [facility] called for transport. No other CSW needs identified - CSW signing off.   Lincoln MaxinKelly Rolena Knutson, LCSW [facility] Clinical Social Worker cell #: [phone]

## 2015-05-18 NOTE — Discharge Summary (Signed)
Physician Discharge Summary  Ann Kelley ZOX:[id number] DOB: 1925-11-10 DOA: 05/16/2015  PCP: Tommie Raymond, MD  Admit date: 05/16/2015 Discharge date: 05/18/2015  Recommendations for Outpatient Follow-up:  Hold eliquis until you are seen by PCP who can recheck hemoglobin and if stable then you may resume eliquis  Discharge Diagnoses:  Active Problems:   Bleeding   Pressure ulcer   Protein-calorie malnutrition, severe    Discharge Condition: stable   Diet recommendation: as tolerated   History of present illness:  81 year-old female with a history of dementia, diagnosed with PE in August 2015 and has been on Eliquis since, per family report patient has been severely depressed ever since her husband has passed away and has been bedbound. Patient presented to [facility] long ED for evaluation of lethargy, poor by mouth intake. In ED, patient was noted to have blood in the urine and stool. FOBT was positive for blood. Her hemoglobin was 6.8 on the admission. She has received 2 units of PRBC so far.  Hospital Course:    Assessment/Plan:    Principal problem: Adult failure to thrive / generalized weakness / dehydration - Progressive failure to thrive with generalized weakness likely secondary to patient's history of depression. - Patient's daughter at the bedside reported that patient has been depressed ever since her husband has passed away and chooses not to eat - Nutritionist and PT evaluation obtained   Active Problems: Acute on chronic encephalopathy / dementia - Progressive dementia.  - Continue Aricept on discharge   Acute blood loss anemia imposed on anemia of IDA, microcytic  - FOBT +. Likely triggered by apixaban which was placed on hold at the time of admission. - Patient has received 2 units of PRBC during this hospital stay.  - Post transfusion hemoglobin is stable.    UTI - UA on admission showed moderate leukocytes - Has received empiric rocephin but  urine culture with no growth so no need to continue abx on discharge.   History of PE - Continue to hold Eliquis for now until CB checked on outpt basis and determined Hgb stable   Hypokalemia - Likely from BP med valsartan-hctz - Supplemented   Right heel DTPI, left lateral LE wound (full thickness) - Wound care consulted. Appreciate their assessment. - Right heel DTPI, left lateral LE wound (full thickness), moisture associated skin damage (MASD), specifically incontinence associated dermatitis (IAD). Measurement: Right heel: 4cm x 5cm area of purple/maroon discoloration consistent with DTPI. Left lateral LE full thickness ulcer (chronic, improving) 2.5cm x 2cm x0.4cm . Sacrum: Healed area of IAD with scattered partial thickness skin breakdown due to friction measuring 4cm x 5cm. Dressing procedure/placement/frequency: A therapeutic mattress with low air loss feature is provided as are bilateral pressure redistribution boots. A conservative POC for the right heel and left lateral LE are in place.  Essential hypertension - Continue losartan-hctz  Cachectic and underweight / Severe protein calorie malnutrition  - Body mass index is 17.86 kg/(m^2). - Nutrition consulted   DVT Prophylaxis  - SCD's bilaterally due to risk of bleeding    Code Status: Full.  Family Communication: plan of care discussed with the patient's daughter at the bedside    IV access:  Peripheral IV  Procedures and diagnostic studies:   No results found.  Medical Consultants:  None   Other Consultants:  WOC Physical therapy  IAnti-Infectives:   Rocephin 05/16/2015 -->    Signed:  Manson Passey, MD  [facility] 05/18/2015, 9:27 AM  Pager #:  [phone]  Time spent in minutes: more than 30 minutes   Discharge Exam: Filed Vitals:   05/18/15 0509  BP: 124/79  Pulse: 88  Temp: 97.6 F (36.4 C)  Resp: 20   Filed Vitals:   05/17/15 0452 05/17/15 1320  05/17/15 2122 05/18/15 0509  BP: 146/83 137/78 121/73 124/79  Pulse: 88 86 87 88  Temp: 97.5 F (36.4 C) 97.9 F (36.6 C) 97.6 F (36.4 C) 97.6 F (36.4 C)  TempSrc: Axillary Axillary Oral Oral  Resp: 20 18 20 20   Height:      Weight:      SpO2: 100% 100% 100% 90%    General: Pt is alert, follows commands appropriately, not in acute distress Cardiovascular: Regular rate and rhythm, S1/S2 +, no murmurs Respiratory: Clear to auscultation bilaterally, no wheezing, no crackles, no rhonchi Abdominal: Soft, non tender, non distended, bowel sounds +, no guarding Extremities: no edema, no cyanosis, pulses palpable bilaterally DP and PT Neuro: Grossly nonfocal  Discharge Instructions  Discharge Instructions    Call MD for:  difficulty breathing, headache or visual disturbances    Complete by:  As directed      Call MD for:  persistant nausea and vomiting    Complete by:  As directed      Call MD for:  severe uncontrolled pain    Complete by:  As directed      Diet - low sodium heart healthy    Complete by:  As directed      Discharge instructions    Complete by:  As directed   Hold eliquis until you are seen by PCP who can recheck hemoglobin and if stable then you may resume eliquis     Increase activity slowly    Complete by:  As directed             Medication List    STOP taking these medications        HYDROcodone-acetaminophen 5-325 MG per tablet  Commonly known as:  NORCO      TAKE these medications        acetaminophen 500 MG tablet  Commonly known as:  TYLENOL  Take 1,000 mg by mouth every 6 (six) hours as needed for moderate pain.     apixaban 5 MG Tabs tablet  Commonly known as:  ELIQUIS  Take 1 tablet (5 mg total) by mouth 2 (two) times daily.     donepezil 5 MG tablet  Commonly known as:  ARICEPT  Take 5 mg by mouth every morning.     feeding supplement (ENSURE ENLIVE) Liqd  Take 237 mLs by mouth 3 (three) times daily between meals.      multivitamin with minerals Tabs tablet  Take 1 tablet by mouth daily.     oxyCODONE-acetaminophen 5-325 MG per tablet  Commonly known as:  ROXICET  Take 1 tablet by mouth every 4 (four) hours as needed for severe pain.     pantoprazole 40 MG tablet  Commonly known as:  PROTONIX  Take 1 tablet (40 mg total) by mouth daily at 12 noon.     valsartan-hydrochlorothiazide 320-12.5 MG per tablet  Commonly known as:  DIOVAN-HCT  Take 1 tablet by mouth daily.           Follow-up Information    Follow up with [facility].   Why:  [facility]   Contact information:   [address] [place] [address] [phone]  Follow up with Tommie RaymondWilson, Amelia P, MD. Schedule an appointment as soon as possible for a visit in 1 week.   Specialty:  Family Medicine   Why:  Follow up appt after recent hospitalization   Contact information:   [address] [place] [id number] [phone]        The results of significant diagnostics from this hospitalization (including imaging, microbiology, ancillary and laboratory) are listed below for reference.    Significant Diagnostic Studies: No results found.  Microbiology: Recent Results (from the past 240 hour(s))  Urine culture     Status: None   Collection Time: 05/16/15 11:15 AM  Result Value Ref Range Status   Specimen Description URINE, CATHETERIZED  Final   Special Requests Normal  Final   Culture   Final    6,000 COLONIES/mL INSIGNIFICANT GROWTH [facility]    Report Status 05/17/2015 FINAL  Final     Labs: Basic Metabolic Panel:  Recent Labs Lab 05/16/15 1228 05/17/15 0420  NA 134* 134*  K 3.2* 3.2*  CL 99* 102  CO2 27 26  GLUCOSE 89 76  BUN 8 6  CREATININE 0.45 0.48  CALCIUM 7.9* 7.8*   Liver Function Tests:  Recent Labs Lab 05/16/15 1228  AST 17  ALT 7*  ALKPHOS 104  BILITOT 0.6  PROT 6.0*  ALBUMIN 2.2*   No results for input(s): LIPASE, AMYLASE in the last  168 hours. No results for input(s): AMMONIA in the last 168 hours. CBC:  Recent Labs Lab 05/16/15 1228 05/17/15 0420 05/18/15 0900  WBC 8.5 9.0 9.6  NEUTROABS 6.2  --   --   HGB 6.8* 10.5* 10.5*  HCT 23.8* 33.5* 34.7*  MCV 69.6* 76.5* 77.5*  PLT 377 218 246   Cardiac Enzymes: No results for input(s): CKTOTAL, CKMB, CKMBINDEX, TROPONINI in the last 168 hours. BNP: BNP (last 3 results) No results for input(s): BNP in the last 8760 hours.  ProBNP (last 3 results)  Recent Labs  05/23/14 1016  PROBNP 127.2    CBG: No results for input(s): GLUCAP in the last 168 hours.

## 2015-05-30 ENCOUNTER — Inpatient Hospital Stay (HOSPITAL_COMMUNITY)
Admission: EM | Admit: 2015-05-30 | Discharge: 2015-06-04 | DRG: 177 | Disposition: A | Discharge status: Home Health | Payer: Medicare Other | Attending: Internal Medicine | Admitting: Internal Medicine

## 2015-05-30 ENCOUNTER — Encounter (HOSPITAL_COMMUNITY): Payer: Self-pay | Admitting: Radiology

## 2015-05-30 ENCOUNTER — Emergency Department (HOSPITAL_COMMUNITY): Payer: Medicare Other

## 2015-05-30 DIAGNOSIS — I10 Essential (primary) hypertension: Secondary | ICD-10-CM | POA: Diagnosis present

## 2015-05-30 DIAGNOSIS — E876 Hypokalemia: Secondary | ICD-10-CM | POA: Diagnosis present

## 2015-05-30 DIAGNOSIS — Z66 Do not resuscitate: Secondary | ICD-10-CM | POA: Diagnosis present

## 2015-05-30 DIAGNOSIS — R0602 Shortness of breath: Secondary | ICD-10-CM

## 2015-05-30 DIAGNOSIS — Z79899 Other long term (current) drug therapy: Secondary | ICD-10-CM

## 2015-05-30 DIAGNOSIS — G309 Alzheimer's disease, unspecified: Secondary | ICD-10-CM | POA: Diagnosis present

## 2015-05-30 DIAGNOSIS — F0280 Dementia in other diseases classified elsewhere without behavioral disturbance: Secondary | ICD-10-CM | POA: Diagnosis present

## 2015-05-30 DIAGNOSIS — Z79891 Long term (current) use of opiate analgesic: Secondary | ICD-10-CM | POA: Diagnosis not present

## 2015-05-30 DIAGNOSIS — D62 Acute posthemorrhagic anemia: Secondary | ICD-10-CM | POA: Diagnosis present

## 2015-05-30 DIAGNOSIS — J189 Pneumonia, unspecified organism: Secondary | ICD-10-CM | POA: Diagnosis present

## 2015-05-30 DIAGNOSIS — Z809 Family history of malignant neoplasm, unspecified: Secondary | ICD-10-CM

## 2015-05-30 DIAGNOSIS — R0600 Dyspnea, unspecified: Secondary | ICD-10-CM

## 2015-05-30 DIAGNOSIS — F17200 Nicotine dependence, unspecified, uncomplicated: Secondary | ICD-10-CM | POA: Diagnosis present

## 2015-05-30 DIAGNOSIS — Y95 Nosocomial condition: Secondary | ICD-10-CM | POA: Diagnosis present

## 2015-05-30 DIAGNOSIS — R059 Cough, unspecified: Secondary | ICD-10-CM

## 2015-05-30 DIAGNOSIS — Z7401 Bed confinement status: Secondary | ICD-10-CM

## 2015-05-30 DIAGNOSIS — Z86711 Personal history of pulmonary embolism: Secondary | ICD-10-CM | POA: Diagnosis not present

## 2015-05-30 DIAGNOSIS — Z8249 Family history of ischemic heart disease and other diseases of the circulatory system: Secondary | ICD-10-CM

## 2015-05-30 DIAGNOSIS — Z86718 Personal history of other venous thrombosis and embolism: Secondary | ICD-10-CM | POA: Diagnosis not present

## 2015-05-30 DIAGNOSIS — I2699 Other pulmonary embolism without acute cor pulmonale: Secondary | ICD-10-CM | POA: Diagnosis present

## 2015-05-30 DIAGNOSIS — Z7901 Long term (current) use of anticoagulants: Secondary | ICD-10-CM

## 2015-05-30 DIAGNOSIS — E43 Unspecified severe protein-calorie malnutrition: Secondary | ICD-10-CM | POA: Diagnosis present

## 2015-05-30 DIAGNOSIS — Z681 Body mass index (BMI) 19 or less, adult: Secondary | ICD-10-CM

## 2015-05-30 DIAGNOSIS — J690 Pneumonitis due to inhalation of food and vomit: Principal | ICD-10-CM | POA: Diagnosis present

## 2015-05-30 DIAGNOSIS — R52 Pain, unspecified: Secondary | ICD-10-CM

## 2015-05-30 DIAGNOSIS — R05 Cough: Secondary | ICD-10-CM

## 2015-05-30 DIAGNOSIS — F17210 Nicotine dependence, cigarettes, uncomplicated: Secondary | ICD-10-CM | POA: Diagnosis present

## 2015-05-30 DIAGNOSIS — K219 Gastro-esophageal reflux disease without esophagitis: Secondary | ICD-10-CM | POA: Diagnosis present

## 2015-05-30 DIAGNOSIS — F0390 Unspecified dementia without behavioral disturbance: Secondary | ICD-10-CM | POA: Diagnosis present

## 2015-05-30 DIAGNOSIS — I071 Rheumatic tricuspid insufficiency: Secondary | ICD-10-CM | POA: Diagnosis present

## 2015-05-30 DIAGNOSIS — K922 Gastrointestinal hemorrhage, unspecified: Secondary | ICD-10-CM | POA: Diagnosis present

## 2015-05-30 DIAGNOSIS — L89612 Pressure ulcer of right heel, stage 2: Secondary | ICD-10-CM | POA: Diagnosis present

## 2015-05-30 DIAGNOSIS — D509 Iron deficiency anemia, unspecified: Secondary | ICD-10-CM | POA: Diagnosis present

## 2015-05-30 DIAGNOSIS — L89622 Pressure ulcer of left heel, stage 2: Secondary | ICD-10-CM | POA: Diagnosis present

## 2015-05-30 DIAGNOSIS — R0902 Hypoxemia: Secondary | ICD-10-CM | POA: Diagnosis present

## 2015-05-30 LAB — CBC
HCT: 37.7 % (ref 36.0–46.0)
HEMATOCRIT: 33.5 % — AB (ref 36.0–46.0)
HEMOGLOBIN: 11.5 g/dL — AB (ref 12.0–15.0)
HEMOGLOBIN: 10 g/dL — AB (ref 12.0–15.0)
MCH: 24.5 pg — AB (ref 26.0–34.0)
MCH: 24 pg — ABNORMAL LOW (ref 26.0–34.0)
MCHC: 30.5 g/dL (ref 30.0–36.0)
MCHC: 29.9 g/dL — ABNORMAL LOW (ref 30.0–36.0)
MCV: 80.4 fL (ref 78.0–100.0)
MCV: 80.3 fL (ref 78.0–100.0)
Platelets: 205 10*3/uL (ref 150–400)
Platelets: 191 10*3/uL (ref 150–400)
RBC: 4.69 MIL/uL (ref 3.87–5.11)
RBC: 4.17 MIL/uL (ref 3.87–5.11)
RDW: 28 % — ABNORMAL HIGH (ref 11.5–15.5)
RDW: 27.8 % — AB (ref 11.5–15.5)
WBC: 19.1 10*3/uL — ABNORMAL HIGH (ref 4.0–10.5)
WBC: 20.3 10*3/uL — AB (ref 4.0–10.5)

## 2015-05-30 LAB — TYPE AND SCREEN
ABO/RH(D): O POS
Antibody Screen: NEGATIVE

## 2015-05-30 LAB — COMPREHENSIVE METABOLIC PANEL
ALT: 9 U/L — AB (ref 14–54)
ANION GAP: 9 (ref 5–15)
AST: 18 U/L (ref 15–41)
Albumin: 2.2 g/dL — ABNORMAL LOW (ref 3.5–5.0)
Alkaline Phosphatase: 99 U/L (ref 38–126)
BUN: 19 mg/dL (ref 6–20)
CALCIUM: 8.7 mg/dL — AB (ref 8.9–10.3)
CO2: 29 mmol/L (ref 22–32)
Chloride: 108 mmol/L (ref 101–111)
Creatinine, Ser: 0.66 mg/dL (ref 0.44–1.00)
GFR calc non Af Amer: >60 mL/min (ref >60)
Glucose, Bld: 106 mg/dL — ABNORMAL HIGH (ref 65–99)
Potassium: 3.4 mmol/L — ABNORMAL LOW (ref 3.5–5.1)
SODIUM: 146 mmol/L — AB (ref 135–145)
TOTAL PROTEIN: 6.4 g/dL — AB (ref 6.5–8.1)
Total Bilirubin: 0.6 mg/dL (ref 0.3–1.2)

## 2015-05-30 LAB — URINALYSIS, ROUTINE W REFLEX MICROSCOPIC
GLUCOSE, UA: NEGATIVE mg/dL
Hgb urine dipstick: NEGATIVE
Ketones, ur: NEGATIVE mg/dL
Nitrite: NEGATIVE
Protein, ur: 30 mg/dL — AB
Specific Gravity, Urine: 1.023 (ref 1.005–1.030)
Urobilinogen, UA: 0.2 mg/dL (ref 0.0–1.0)
pH: 5.5 (ref 5.0–8.0)

## 2015-05-30 LAB — URINE MICROSCOPIC-ADD ON

## 2015-05-30 LAB — TROPONIN I: Troponin I: 0.09 ng/mL — ABNORMAL HIGH (ref <0.031)

## 2015-05-30 LAB — POC OCCULT BLOOD, ED: Fecal Occult Bld: NEGATIVE

## 2015-05-30 LAB — MRSA PCR SCREENING: MRSA by PCR: NEGATIVE

## 2015-05-30 LAB — APTT: aPTT: 49 seconds — ABNORMAL HIGH (ref 24–37)

## 2015-05-30 LAB — HEPARIN LEVEL (UNFRACTIONATED): Heparin Unfractionated: 0.66 IU/mL (ref 0.30–0.70)

## 2015-05-30 MED ORDER — OXYCODONE-ACETAMINOPHEN 5-325 MG PO TABS
1.0000 | ORAL_TABLET | ORAL | Status: DC | PRN
  Administered 2015-05-30 – 2015-06-03 (×7): 1 via ORAL
  Filled 2015-05-30 (×7): qty 1

## 2015-05-30 MED ORDER — ACETAMINOPHEN 325 MG PO TABS: 650.0000 mg | ORAL_TABLET | Freq: Four times a day (QID) | ORAL | Status: DC | PRN

## 2015-05-30 MED ORDER — SODIUM CHLORIDE 0.9 % IV BOLUS (SEPSIS)
500.0000 mL | Freq: Once | INTRAVENOUS | Status: AC
  Administered 2015-05-30: 500 mL via INTRAVENOUS

## 2015-05-30 MED ORDER — HYDROCHLOROTHIAZIDE 12.5 MG PO CAPS
12.5000 mg | ORAL_CAPSULE | Freq: Every day | ORAL | Status: DC
  Administered 2015-05-30 – 2015-06-04 (×6): 12.5 mg via ORAL
  Filled 2015-05-30 (×6): qty 1

## 2015-05-30 MED ORDER — DONEPEZIL HCL 5 MG PO TABS
5.0000 mg | ORAL_TABLET | Freq: Every morning | ORAL | Status: DC
  Administered 2015-05-31 – 2015-06-04 (×5): 5 mg via ORAL
  Filled 2015-05-30 (×5): qty 1

## 2015-05-30 MED ORDER — IOHEXOL 350 MG/ML SOLN
100.0000 mL | Freq: Once | INTRAVENOUS | Status: AC | PRN
  Administered 2015-05-30: 100 mL via INTRAVENOUS

## 2015-05-30 MED ORDER — VANCOMYCIN HCL IN DEXTROSE 750-5 MG/150ML-% IV SOLN
750.0000 mg | INTRAVENOUS | Status: DC
Start: 2015-05-31 — End: 2015-06-02
  Administered 2015-05-31 – 2015-06-01 (×2): 750 mg via INTRAVENOUS
  Filled 2015-05-30 (×4): qty 150

## 2015-05-30 MED ORDER — ENOXAPARIN SODIUM 60 MG/0.6ML ~~LOC~~ SOLN
1.0000 mg/kg | Freq: Once | SUBCUTANEOUS | Status: AC
  Administered 2015-05-30: 50 mg via SUBCUTANEOUS
  Filled 2015-05-30: qty 1

## 2015-05-30 MED ORDER — ENSURE ENLIVE PO LIQD
237.0000 mL | Freq: Three times a day (TID) | ORAL | Status: DC
  Administered 2015-05-30 – 2015-05-31 (×2): 237 mL via ORAL

## 2015-05-30 MED ORDER — ACETAMINOPHEN 650 MG RE SUPP: 650.0000 mg | Freq: Four times a day (QID) | RECTAL | Status: DC | PRN

## 2015-05-30 MED ORDER — PIPERACILLIN-TAZOBACTAM 3.375 G IVPB
3.3750 g | Freq: Three times a day (TID) | INTRAVENOUS | Status: DC
  Administered 2015-05-30 – 2015-06-02 (×8): 3.375 g via INTRAVENOUS
  Filled 2015-05-30 (×8): qty 50

## 2015-05-30 MED ORDER — ONDANSETRON HCL 4 MG PO TABS: 4.0000 mg | ORAL_TABLET | Freq: Four times a day (QID) | ORAL | Status: DC | PRN

## 2015-05-30 MED ORDER — PANTOPRAZOLE SODIUM 40 MG PO TBEC
40.0000 mg | DELAYED_RELEASE_TABLET | Freq: Every day | ORAL | Status: DC
  Administered 2015-05-30 – 2015-06-04 (×5): 40 mg via ORAL
  Filled 2015-05-30 (×6): qty 1

## 2015-05-30 MED ORDER — GUAIFENESIN ER 600 MG PO TB12
1200.0000 mg | ORAL_TABLET | Freq: Two times a day (BID) | ORAL | Status: DC
  Administered 2015-05-30 – 2015-06-03 (×8): 1200 mg via ORAL
  Filled 2015-05-30 (×8): qty 2

## 2015-05-30 MED ORDER — SODIUM CHLORIDE 0.9 % IV SOLN
INTRAVENOUS | Status: DC
  Administered 2015-05-30: 20 mL/h via INTRAVENOUS
  Administered 2015-05-30 – 2015-06-01 (×2): via INTRAVENOUS

## 2015-05-30 MED ORDER — DOCUSATE SODIUM 100 MG PO CAPS
100.0000 mg | ORAL_CAPSULE | Freq: Two times a day (BID) | ORAL | Status: DC
  Administered 2015-05-30 – 2015-05-31 (×2): 100 mg via ORAL
  Filled 2015-05-30 (×2): qty 1

## 2015-05-30 MED ORDER — SODIUM CHLORIDE 0.9 % IJ SOLN
3.0000 mL | Freq: Two times a day (BID) | INTRAMUSCULAR | Status: DC
  Administered 2015-05-31 – 2015-06-02 (×4): 3 mL via INTRAVENOUS

## 2015-05-30 MED ORDER — VALSARTAN-HYDROCHLOROTHIAZIDE 320-12.5 MG PO TABS: 1.0000 | ORAL_TABLET | Freq: Every day | ORAL | Status: DC

## 2015-05-30 MED ORDER — VANCOMYCIN HCL IN DEXTROSE 1-5 GM/200ML-% IV SOLN
1000.0000 mg | Freq: Once | INTRAVENOUS | Status: AC
  Administered 2015-05-30: 1000 mg via INTRAVENOUS
  Filled 2015-05-30 (×2): qty 200

## 2015-05-30 MED ORDER — ALUM & MAG HYDROXIDE-SIMETH 200-200-20 MG/5ML PO SUSP: 30.0000 mL | Freq: Four times a day (QID) | ORAL | Status: DC | PRN

## 2015-05-30 MED ORDER — ONDANSETRON HCL 4 MG/2ML IJ SOLN: 4.0000 mg | Freq: Four times a day (QID) | INTRAMUSCULAR | Status: DC | PRN

## 2015-05-30 MED ORDER — IRBESARTAN 150 MG PO TABS
300.0000 mg | ORAL_TABLET | Freq: Every day | ORAL | Status: DC
  Administered 2015-05-30 – 2015-06-04 (×6): 300 mg via ORAL
  Filled 2015-05-30: qty 2
  Filled 2015-05-30: qty 1
  Filled 2015-05-30: qty 2
  Filled 2015-05-30 (×3): qty 1
  Filled 2015-05-30: qty 2

## 2015-05-30 MED ORDER — ZOLPIDEM TARTRATE 5 MG PO TABS
5.0000 mg | ORAL_TABLET | Freq: Every evening | ORAL | Status: DC | PRN
  Administered 2015-06-01: 5 mg via ORAL
  Filled 2015-05-30: qty 1

## 2015-05-30 MED ORDER — PIPERACILLIN-TAZOBACTAM 3.375 G IVPB 30 MIN
3.3750 g | Freq: Once | INTRAVENOUS | Status: AC
  Administered 2015-05-30: 3.375 g via INTRAVENOUS
  Filled 2015-05-30: qty 50

## 2015-05-30 MED ORDER — TRAMADOL HCL 50 MG PO TABS
50.0000 mg | ORAL_TABLET | Freq: Four times a day (QID) | ORAL | Status: DC | PRN
  Administered 2015-06-01 – 2015-06-02 (×3): 50 mg via ORAL
  Filled 2015-05-30 (×3): qty 1

## 2015-05-30 MED ORDER — HEPARIN (PORCINE) IN NACL 100-0.45 UNIT/ML-% IJ SOLN
800.0000 [IU]/h | INTRAMUSCULAR | Status: DC
  Administered 2015-05-31: 800 [IU]/h via INTRAVENOUS
  Filled 2015-05-30: qty 250

## 2015-05-30 MED ORDER — NICOTINE 21 MG/24HR TD PT24
21.0000 mg | MEDICATED_PATCH | Freq: Every day | TRANSDERMAL | Status: DC
  Administered 2015-05-30 – 2015-06-04 (×6): 21 mg via TRANSDERMAL
  Filled 2015-05-30 (×6): qty 1

## 2015-05-30 MED ORDER — ALBUTEROL SULFATE (2.5 MG/3ML) 0.083% IN NEBU: 2.5000 mg | INHALATION_SOLUTION | RESPIRATORY_TRACT | Status: DC | PRN

## 2015-05-30 MED ORDER — SENNOSIDES-DOCUSATE SODIUM 8.6-50 MG PO TABS: 1.0000 | ORAL_TABLET | Freq: Every evening | ORAL | Status: DC | PRN

## 2015-05-30 NOTE — ED Notes (Signed)
Attempted to call report to ICU. Receiving nurse unable to get report and stated she would call back.

## 2015-05-30 NOTE — ED Provider Notes (Addendum)
CSN: [id number]     Arrival date & time 05/30/15  1053 History   First MD Initiated Contact with Patient 05/30/15 1054     Chief Complaint  Patient presents with  . Weakness  . Shortness of Breath     (Consider location/radiation/quality/duration/timing/severity/associated sxs/prior Treatment) Patient is a 81 y.o. female presenting with weakness and shortness of breath. The history is provided by the patient, a relative and the EMS personnel. The history is limited by the condition of the patient.  Weakness Associated symptoms include shortness of breath.  Shortness of Breath Associated symptoms: cough   Patient w hx dementia, noted by family to have increased wob in the past day, increase in non productive cough, phlegm and chest congestion.  +smoker, family stating 'I knew something was wrong when she didn't want to smoke yesterday'. No fevers. Poor po intake per family. Pt limited historian - dementia - level 5 caveat.  Pt states pt bedbound, confused, at baseline.     Past Medical History  Diagnosis Date  . Hypertension   . Dementia   . GERD (gastroesophageal reflux disease)   . DVT (deep venous thrombosis)    Past Surgical History  Procedure Laterality Date  . Appendectomy    . Abdominal hysterectomy     Family History  Problem Relation Age of Onset  . Cancer Mother   . Heart failure Father    History  Substance Use Topics  . Smoking status: Current Every Day Smoker -- 1.50 packs/day for 15 years    Types: Cigarettes  . Smokeless tobacco: Not on file  . Alcohol Use: No   OB History    No data available       Review of Systems  Unable to perform ROS: Dementia  Respiratory: Positive for cough and shortness of breath.   Neurological: Positive for weakness.  level 5 caveat - dementia    Allergies  Review of patient's allergies indicates no known allergies.  Home Medications   Prior to Admission medications   Medication Sig Start Date End Date Taking?  Authorizing Provider  acetaminophen (TYLENOL) 500 MG tablet Take 1,000 mg by mouth every 6 (six) hours as needed for moderate pain.    Historical Provider, MD  apixaban (ELIQUIS) 5 MG TABS tablet Take 1 tablet (5 mg total) by mouth 2 (two) times daily. Patient taking differently: Take 5 mg by mouth daily.  06/16/14   Vassie Loll, MD  donepezil (ARICEPT) 5 MG tablet Take 5 mg by mouth every morning.  05/12/14   Historical Provider, MD  feeding supplement, ENSURE ENLIVE, (ENSURE ENLIVE) LIQD Take 237 mLs by mouth 3 (three) times daily between meals. 05/18/15   Alison Murray, MD  Multiple Vitamin (MULTIVITAMIN WITH MINERALS) TABS tablet Take 1 tablet by mouth daily.    Historical Provider, MD  oxyCODONE-acetaminophen (ROXICET) 5-325 MG per tablet Take 1 tablet by mouth every 4 (four) hours as needed for severe pain. 05/18/15   Alison Murray, MD  pantoprazole (PROTONIX) 40 MG tablet Take 1 tablet (40 mg total) by mouth daily at 12 noon. 06/09/14   Vassie Loll, MD  valsartan-hydrochlorothiazide (DIOVAN-HCT) 320-12.5 MG per tablet Take 1 tablet by mouth daily.  05/11/14   Historical Provider, MD   SpO2 94% Physical Exam  Constitutional: She appears well-developed and well-nourished. No distress.  HENT:  Head: Atraumatic.  Mouth/Throat: Oropharynx is clear and moist.  Eyes: Conjunctivae are normal. No scleral icterus.  Neck: Neck supple. No tracheal deviation present.  No stiffness or rigidity  Cardiovascular: Normal rate, regular rhythm, normal heart sounds and intact distal pulses.   Pulmonary/Chest: Effort normal. No respiratory distress. She has rales.  Rhonchi left lower  Abdominal: Soft. Normal appearance and bowel sounds are normal. She exhibits no distension. There is no tenderness.  Genitourinary:  No cva tenderness  Musculoskeletal: She exhibits no edema or tenderness.  Superficial pressure sore right heel, left ankle, without sign of infection.   Neurological: She is alert.  Moves bil ext  purposefully w good strength.   Skin: Skin is warm and dry. No rash noted. She is not diaphoretic.  Psychiatric: She has a normal mood and affect.  Nursing note and vitals reviewed.   ED Course  Procedures (including critical care time) Labs Review  Results for orders placed or performed during the hospital encounter of 05/30/15  CBC  Result Value Ref Range   WBC 19.1 (H) 4.0 - 10.5 K/uL   RBC 4.69 3.87 - 5.11 MIL/uL   Hemoglobin 11.5 (L) 12.0 - 15.0 g/dL   HCT 24.4 01.0 - 27.2 %   MCV 80.4 78.0 - 100.0 fL   MCH 24.5 (L) 26.0 - 34.0 pg   MCHC 30.5 30.0 - 36.0 g/dL   RDW 53.6 (H) 64.4 - 03.4 %   Platelets 205 150 - 400 K/uL  Comprehensive metabolic panel  Result Value Ref Range   Sodium 146 (H) 135 - 145 mmol/L   Potassium 3.4 (L) 3.5 - 5.1 mmol/L   Chloride 108 101 - 111 mmol/L   CO2 29 22 - 32 mmol/L   Glucose, Bld 106 (H) 65 - 99 mg/dL   BUN 19 6 - 20 mg/dL   Creatinine, Ser 7.42 0.44 - 1.00 mg/dL   Calcium 8.7 (L) 8.9 - 10.3 mg/dL   Total Protein 6.4 (L) 6.5 - 8.1 g/dL   Albumin 2.2 (L) 3.5 - 5.0 g/dL   AST 18 15 - 41 U/L   ALT 9 (L) 14 - 54 U/L   Alkaline Phosphatase 99 38 - 126 U/L   Total Bilirubin 0.6 0.3 - 1.2 mg/dL   GFR calc non Af Amer >60 >60 mL/min   GFR calc Af Amer >60 >60 mL/min   Anion gap 9 5 - 15  Urinalysis, Routine w reflex microscopic (not at [facility])  Result Value Ref Range   Color, Urine AMBER (A) YELLOW   APPearance CLOUDY (A) CLEAR   Specific Gravity, Urine 1.023 1.005 - 1.030   pH 5.5 5.0 - 8.0   Glucose, UA NEGATIVE NEGATIVE mg/dL   Hgb urine dipstick NEGATIVE NEGATIVE   Bilirubin Urine SMALL (A) NEGATIVE   Ketones, ur NEGATIVE NEGATIVE mg/dL   Protein, ur 30 (A) NEGATIVE mg/dL   Urobilinogen, UA 0.2 0.0 - 1.0 mg/dL   Nitrite NEGATIVE NEGATIVE   Leukocytes, UA TRACE (A) NEGATIVE  Troponin I  Result Value Ref Range   Troponin I 0.09 (H) <0.031 ng/mL  Urine microscopic-add on  Result Value Ref Range   Squamous Epithelial / LPF FEW (A)  RARE   WBC, UA 3-6 <3 WBC/hpf   Bacteria, UA FEW (A) RARE   Casts HYALINE CASTS (A) NEGATIVE   Urine-Other MUCOUS PRESENT   POC occult blood, ED RN will collect  Result Value Ref Range   Fecal Occult Bld NEGATIVE NEGATIVE  Type and screen  Result Value Ref Range   ABO/RH(D) O POS    Antibody Screen NEG    Sample Expiration 06/02/2015  Dg Chest 2 View  05/30/2015   CLINICAL DATA:  Smoker with 1 day history of cough and chest congestion. Personal history of right middle lobe pulmonary embolus in August, 2015.  EXAM: CHEST  2 VIEW  COMPARISON:  06/07/2014 and earlier, including CTA chest 06/07/2014.  FINDINGS: AP semi-erect and lateral images were obtained. Lateral image less than optimal due to overlying clothing. Cardiac silhouette mildly to moderately enlarged, unchanged. Pleuroparenchymal scarring in the left upper lobe, unchanged. Emphysematous changes throughout both lungs, unchanged. Suboptimal inspiration accounts for atelectasis in the left lower lobe. Lungs otherwise clear. No visible pleural effusions. No pneumothorax. Osseous demineralization with old compression fracture of what I believe is the T7 vertebral body.  IMPRESSION: 1. Suboptimal inspiration accounts for mild left lower lobe atelectasis. No acute cardiopulmonary disease otherwise. 2. COPD/emphysema. Stable pleuroparenchymal scarring involving the left upper lobe. 3. Stable mild to moderate cardiomegaly without pulmonary edema.   Electronically Signed   By: Hulan Saas M.D.   On: 05/30/2015 11:43   Ct Angio Chest Pe W/cm &/or Wo Cm  05/30/2015   CLINICAL DATA:  Shortness of breath, hypoxia, at rectal bleeding recently and Eloquist was discontinued  EXAM: CT ANGIOGRAPHY CHEST WITH CONTRAST  TECHNIQUE: Multidetector CT imaging of the chest was performed using the standard protocol during bolus administration of intravenous contrast. Multiplanar CT image reconstructions and MIPs were obtained to evaluate the vascular  anatomy.  CONTRAST:  OMNIPAQUE IOHEXOL 350 MG/ML SOLN  COMPARISON:  06/07/2014  FINDINGS: There is adequate opacification of the pulmonary arteries. Large pulmonary embolus in the right main pulmonary artery extending into the right upper, middle and lower lobar branches. There is dilatation of the right main pulmonary artery. The heart size is normal. There is no pericardial effusion. Coronary artery atherosclerosis involving the left main, lad, circumflex and RCA.  There is mild right basilar atelectasis. There is left lower lobe collapse with occlusion of the left lower lobe bronchus without a definitive obstructing mass. There is bilateral mild centrilobular emphysema.  There is no axillary, hilar, or mediastinal adenopathy.  There is no lytic or blastic osseous lesion.  The visualized portions of the upper abdomen are unremarkable.  Review of the MIP images confirms the above findings.  IMPRESSION: 1. Large pulmonary embolus in the right main pulmonary artery extending into the right upper, middle and lower lobar branches. Positive for acute PE with CT evidence of right heart strain (RV/LV Ratio = 1.17) consistent with at least submassive (intermediate risk) PE. The presence of right heart strain has been associated with an increased risk of morbidity and mortality. Please activate Code PE by paging [phone]. Critical Value/emergent results were called by telephone at the time of interpretation on 05/30/2015 at 1:27 pm to Dr. Cathren Laine , who verbally acknowledged these results. 2. There is mild right basilar atelectasis. There is left lower lobe collapse with occlusion of the left lower lobe bronchus without a definitive obstructing mass. Recommend follow-up CT chest in 3 months.   Electronically Signed   By: Elige Ko   On: 05/30/2015 13:33      MDM   Iv ns. Labs. Cxr.  Room air pulse ox in mid to low 80's. 02 [place], sats 95%.   Reviewed nursing notes and prior charts for additional  history.   Recent admission for heme pos stools, had been on eliquis previously, was held.   o2 [place]. Continuous pulse ox and monitor.   Given hx dvt/pe, off anticoag recently due  to heme positive stool, will get ct angio chest.  Radiology called, indicates new right PE, as well as left lower air space disease. Pt does have worsening cough, rhonchi on exam, and elevated wbc.  Will rx for possible hcap.   Zosyn and vanc iv.    As todays stools heme neg, and hgb stable, will rx lovenox for PE.  CRITICAL CARE  RE dyspnea, weakness, hypoxia (room air o2 sats low 80's), large right pulmonary embolus, hcap.  Performed by: Suzi Roots Total critical care time: 35 Critical care time was exclusive of separately billable procedures and treating other patients. Critical care was necessary to treat or prevent imminent or life-threatening deterioration. Critical care was time spent personally by me on the following activities: development of treatment plan with patient and/or surrogate as well as nursing, discussions with consultants, evaluation of patient's response to treatment, examination of patient, obtaining history from patient or surrogate, ordering and performing treatments and interventions, ordering and review of laboratory studies, ordering and review of radiographic studies, pulse oximetry and re-evaluation of patient's condition.  Given large right PE, right heart strain noted on ct, discussed with PCCM/ICU doc on call, Dr Delton Coombes  - he indicates given recent heme pos stools, co-morbidities, is not candidate for thrombolysis or cath directed thrombolysis, and to admit to medical service/stepdown bed.  Discussed pccm rec w hospitalist.   Recheck pt, alert, talking w family, rr 16, pulse ox 98% 2 liters, bp normal.       Cathren Laine, MD 05/30/15 1419

## 2015-05-30 NOTE — H&P (Addendum)
Patient Demographics  Ann Kelley, is a 81 y.o. female  MRN: [id number]   DOB - 03-11-1926  Admit Date - 05/30/2015  Outpatient Primary MD for the patient is Pola Corn, MD   Assessment Ann Kelley is an 81 year old female with multiple medical problems including Alzheimer's dementia/severe protein calorie malnutrition/recent acute blood loss anemia from a GI source/history of DVT/PE on Eliquis until recently, who comes in because of increasing shortness of breath associated with cough productive of yellow colored sputum for the last 3 days and she is found to have acute pulmonary embolism/healthcare associated pneumonia with CT chest showing " 1. Large pulmonary embolus in the right main pulmonary artery extending into the right upper, middle and lower lobar branches. Positive for acute PE with CT evidence of right heart strain (RV/LV Ratio = 1.17) consistent with at least submassive (intermediate risk) PE. The presence of right heart strain has been associated with an increased risk of morbidity and mortality. Please activate Code PE by paging [phone]. Critical Value/emergent results were called by telephone at the time of interpretation on 05/30/2015 at 1:27 pm to Dr. Cathren Laine , who verbally acknowledged these results. 2. There is mild right basilar atelectasis. There is left lower lobe collapse with occlusion of the left lower lobe bronchus without a definitive obstructing mass. Recommend follow-up CT chest in 3 months". Her white count is 19,100, hemoglobin 11.5 g/dl. the question for this lady's care is whether she'll be able to tolerate anticoagulation given that she had GI bleed recently, requiring PRBC transfusion which is why she was taken off anticoagulation. I have gone over the treatment pathways for the PE in the setting of potential GI bleed with the family(her 3 daughters wanted decision-makers on her behalf), including #1 IVC filter, #2 anticoagulation and watch and  wait #3 pursuing GI workup to try to diagnose the source of GI bleeding and if treatable have it addressed and patient continue with anticoagulation. It is not clear what caused the GI bleeding to start with and the family was not interested in endoscopy at that time and they maintain the same stance now. They have elected to try anticoagulation and if patient bleeds then anticoagulation will be discontinued and they will re-assess the options although they seem inclined to do less. Pulmonary was consulted by the ED and recommended they wouldn't pursue aggressive thrombolytic therapy given patient's debility and recent GI bleed. We will therefore admit patient to the stepdown unit, follow septic workup including sputum culture/urine culture/blood cultures, start broad-spectrum antibiotic to cover for nosocomial organisms including MRSA/Pseudomonas given the fact that patient was hospitalized less than 90 days ago, start anticoagulation with heparin with plans to monitor hemoglobin and evidence of bleeding. If significant drop in hemoglobin would stop anticoagulation and confer with family regarding way forward. Will also obtain bilateral lower extremity venous Doppler/2-D echocardiogram to assess degree of right ventricular strain. At this point patient is full code. Family will continue to discuss goals of care given the fact that they have preferences that limit optimal intervention regarding treatment pathways. Plan PE (pulmonary thromboembolism)  Admit to the stepdown unit for close cardiopulmonary monitoring  Venous Doppler to check for Dvt as patient may need IVC filter/2-D echocardiogram  IV heparin, pharmacy to dose. Appreciate help.  Monitor hemoglobin, if significant drop will have to discontinue the heparin and consult with the family regarding further steps-? Hospice route HCAP (healthcare-associated pneumonia)/Tobacco dependency  Septic workup  Vancomycin/Zosyn to complete 7 days of  antibiotic's/nicotine patch/broncho-dilators Dementia/Protein-calorie malnutrition, severe  No acute changes  Resume home medications Acute blood loss anemia/Anemia, iron deficiency  Monitor hemoglobin, transfuse if PRBC less than 8 g/dL  Discontinue anticoagulation if overt GI bleeding Acute hypokalemia  Likely related to poor intake  Replenish potassium as needed DVT/GI Prophylaxis  On IV heparin per pharmacy  Protonix Family Communication: Admission, patients condition and plan of care including tests being ordered have been discussed with the patient and daughters who indicate understanding and agree with the plan and Code Status.  Code Status   Full code  Likely DC  TBD  Condition GUARDED    Time spent in minutes : 55  Chief Complaint Chief Complaint  Patient presents with  . Weakness  . Shortness of Breath     HPI Ann Kelley  is a 81 y.o. female who comes in with complains of worsening cough productive of yellow colored sputum associated with shortness of breath. She has also had occasional rectal bleeding which prompted discontinuation of Eliquis recently. Patient does not give any history therefore details are limited but her daughter who lives with her states that patient has been more lethargic and has had worsening cough, been uninterested in food and cigarettes as usual. Daughter denies any vomiting or diarrhea. She says that patient has been mostly bedbound for the past 2 years. She has had DVT/PE in the past. She was admitted recently and at that time the family declined endoscopy. She required PRBC transfusion for rectal bleeding.   Review of Systems   As in the HPI above.   Past Medical History  Diagnosis Date  . Hypertension   . Dementia   . GERD (gastroesophageal reflux disease)   . DVT (deep venous thrombosis)       Past Surgical History  Procedure Laterality Date  . Appendectomy    . Abdominal hysterectomy      Social  History History  Substance Use Topics  . Smoking status: Current Every Day Smoker -- 1.50 packs/day for 15 years    Types: Cigarettes  . Smokeless tobacco: Not on file  . Alcohol Use: No    Family History Family History  Problem Relation Age of Onset  . Cancer Mother   . Heart failure Father     Prior to Admission medications   Medication Sig Start Date End Date Taking? Authorizing Provider  acetaminophen (TYLENOL) 500 MG tablet Take 1,000 mg by mouth every 6 (six) hours as needed for moderate pain.   Yes Historical Provider, MD  apixaban (ELIQUIS) 5 MG TABS tablet Take 1 tablet (5 mg total) by mouth 2 (two) times daily. Patient taking differently: Take 5 mg by mouth daily.  06/16/14  Yes Vassie Loll, MD  donepezil (ARICEPT) 5 MG tablet Take 5 mg by mouth every morning.  05/12/14  Yes Historical Provider, MD  feeding supplement, ENSURE ENLIVE, (ENSURE ENLIVE) LIQD Take 237 mLs by mouth 3 (three) times daily between meals. 05/18/15  Yes Alison Murray, MD  oxyCODONE-acetaminophen (ROXICET) 5-325 MG per tablet Take 1 tablet by mouth every 4 (four) hours as needed for severe pain. Patient taking differently: Take 1 tablet by mouth every 4 (four) hours as needed for severe pain. Ran out of this med 05/18/15  Yes Alison Murray, MD  pantoprazole (PROTONIX) 40 MG tablet Take 1 tablet (40 mg total) by mouth daily at 12 noon. 06/09/14  Yes Vassie Loll, MD  traMADol (ULTRAM) 50 MG tablet Take 50 mg by mouth every  6 (six) hours as needed for moderate pain.  05/25/15  Yes Historical Provider, MD  valsartan-hydrochlorothiazide (DIOVAN-HCT) 320-12.5 MG per tablet Take 1 tablet by mouth daily.  05/11/14  Yes Historical Provider, MD    No Known Allergies  Physical Exam  Vitals  Blood pressure 111/69, pulse 74, temperature 97.6 F (36.4 C), temperature source Oral, resp. rate 23, weight 48.7 kg (107 lb 5.8 oz), SpO2 98 %.   1. General: lying in bed coughing.  2. Normal affect and insight, Not  Suicidal or Homicidal, Awake Alert, Oriented X 3.  3. No F.N deficits, ALL C.Nerves Intact, Strength 45 all 4 extremities, Sensation intact all 4 extremities, Plantars down going.  4. Ears and Eyes appear Normal, Conjunctivae clear, PERRLA. Moist Oral Mucosa.  5. Supple Neck, No JVD, No cervical lymphadenopathy appriciated, No Carotid Bruits.  6. Symmetrical Chest wall movement, Good air movement bilaterally, bilateral transmitted sounds. Wet cough.  7. RRR, No Gallops, Rubs or Murmurs, No Parasternal Heave.  8. Positive Bowel Sounds, Abdomen Soft, Non tender, No organomegaly appriciated,No rebound -guarding or rigidity.  9.  No Cyanosis, Normal Skin Turgor, No Skin Rash or Bruise.  10. Good muscle tone,  joints appear normal , no effusions, Normal ROM.  11. No Palpable Lymph Nodes in Neck or Axillae  Data Review CBC  Recent Labs Lab 05/30/15 1140  WBC 19.1*  HGB 11.5*  HCT 37.7  PLT 205  MCV 80.4  MCH 24.5*  MCHC 30.5  RDW 28.0*   ------------------------------------------------------------------------------------------------------------------  Chemistries   Recent Labs Lab 05/30/15 1140  NA 146*  K 3.4*  CL 108  CO2 29  GLUCOSE 106*  BUN 19  CREATININE 0.66  CALCIUM 8.7*  AST 18  ALT 9*  ALKPHOS 99  BILITOT 0.6   ------------------------------------------------------------------------------------------------------------------ estimated creatinine clearance is 38.1 mL/min (by C-G formula based on Cr of 0.66). ------------------------------------------------------------------------------------------------------------------ No results for input(s): TSH, T4TOTAL, T3FREE, THYROIDAB in the last 72 hours.  Invalid input(s): FREET3   Coagulation profile No results for input(s): INR, PROTIME in the last 168 hours. ------------------------------------------------------------------------------------------------------------------- No results for input(s):  DDIMER in the last 72 hours. -------------------------------------------------------------------------------------------------------------------  Cardiac Enzymes  Recent Labs Lab 05/30/15 1140  TROPONINI 0.09*   ------------------------------------------------------------------------------------------------------------------ Invalid input(s): POCBNP   ---------------------------------------------------------------------------------------------------------------  Urinalysis    Component Value Date/Time   COLORURINE AMBER* 05/30/2015 1205   APPEARANCEUR CLOUDY* 05/30/2015 1205   LABSPEC 1.023 05/30/2015 1205   PHURINE 5.5 05/30/2015 1205   GLUCOSEU NEGATIVE 05/30/2015 1205   HGBUR NEGATIVE 05/30/2015 1205   BILIRUBINUR SMALL* 05/30/2015 1205   KETONESUR NEGATIVE 05/30/2015 1205   PROTEINUR 30* 05/30/2015 1205   UROBILINOGEN 0.2 05/30/2015 1205   NITRITE NEGATIVE 05/30/2015 1205   LEUKOCYTESUR TRACE* 05/30/2015 1205    ----------------------------------------------------------------------------------------------------------------  Imaging results  Dg Chest 2 View  05/30/2015   CLINICAL DATA:  Smoker with 1 day history of cough and chest congestion. Personal history of right middle lobe pulmonary embolus in August, 2015.  EXAM: CHEST  2 VIEW  COMPARISON:  06/07/2014 and earlier, including CTA chest 06/07/2014.  FINDINGS: AP semi-erect and lateral images were obtained. Lateral image less than optimal due to overlying clothing. Cardiac silhouette mildly to moderately enlarged, unchanged. Pleuroparenchymal scarring in the left upper lobe, unchanged. Emphysematous changes throughout both lungs, unchanged. Suboptimal inspiration accounts for atelectasis in the left lower lobe. Lungs otherwise clear. No visible pleural effusions. No pneumothorax. Osseous demineralization with old compression fracture of what I believe is the T7 vertebral body.  IMPRESSION:  1. Suboptimal inspiration  accounts for mild left lower lobe atelectasis. No acute cardiopulmonary disease otherwise. 2. COPD/emphysema. Stable pleuroparenchymal scarring involving the left upper lobe. 3. Stable mild to moderate cardiomegaly without pulmonary edema.   Electronically Signed   By: Hulan Saas M.D.   On: 05/30/2015 11:43   Ct Angio Chest Pe W/cm &/or Wo Cm  05/30/2015   CLINICAL DATA:  Shortness of breath, hypoxia, at rectal bleeding recently and Eloquist was discontinued  EXAM: CT ANGIOGRAPHY CHEST WITH CONTRAST  TECHNIQUE: Multidetector CT imaging of the chest was performed using the standard protocol during bolus administration of intravenous contrast. Multiplanar CT image reconstructions and MIPs were obtained to evaluate the vascular anatomy.  CONTRAST:  OMNIPAQUE IOHEXOL 350 MG/ML SOLN  COMPARISON:  06/07/2014  FINDINGS: There is adequate opacification of the pulmonary arteries. Large pulmonary embolus in the right main pulmonary artery extending into the right upper, middle and lower lobar branches. There is dilatation of the right main pulmonary artery. The heart size is normal. There is no pericardial effusion. Coronary artery atherosclerosis involving the left main, lad, circumflex and RCA.  There is mild right basilar atelectasis. There is left lower lobe collapse with occlusion of the left lower lobe bronchus without a definitive obstructing mass. There is bilateral mild centrilobular emphysema.  There is no axillary, hilar, or mediastinal adenopathy.  There is no lytic or blastic osseous lesion.  The visualized portions of the upper abdomen are unremarkable.  Review of the MIP images confirms the above findings.  IMPRESSION: 1. Large pulmonary embolus in the right main pulmonary artery extending into the right upper, middle and lower lobar branches. Positive for acute PE with CT evidence of right heart strain (RV/LV Ratio = 1.17) consistent with at least submassive (intermediate risk) PE. The presence  of right heart strain has been associated with an increased risk of morbidity and mortality. Please activate Code PE by paging [phone]. Critical Value/emergent results were called by telephone at the time of interpretation on 05/30/2015 at 1:27 pm to Dr. Cathren Laine , who verbally acknowledged these results. 2. There is mild right basilar atelectasis. There is left lower lobe collapse with occlusion of the left lower lobe bronchus without a definitive obstructing mass. Recommend follow-up CT chest in 3 months.   Electronically Signed   By: Elige Ko   On: 05/30/2015 13:33        Jet Armbrust M.D on 05/30/2015 at 6:17 PM  Between 7am to 7pm - Pager - [phone]  After 7pm go to [email or web address] - password [email or web address]  And look for the night coverage person covering me after hours  [facility] Office  [phone]

## 2015-05-30 NOTE — ED Notes (Signed)
Patient given water. Patient's family assisting with getting the patient to drink.

## 2015-05-30 NOTE — ED Notes (Signed)
Bed: ZO10 Expected date:  Expected time:  Means of arrival:  Comments: Diff breathing,

## 2015-05-30 NOTE — ED Notes (Signed)
Per EMS- Patient began having SOB yesterday and was sitting up to sleep. Initial O2 sats 84%. O2 2L/min via [place] placed and sats increased to 94%. Lungs clear. Patient recently had a rectal bleed and Eloquist was discontinued.

## 2015-05-30 NOTE — Progress Notes (Signed)
ANTIBIOTIC CONSULT NOTE - INITIAL  Pharmacy Consult for vancomycin Indication: HCAP vs aspiration PNA  No Known Allergies  Patient Measurements: Weight: 107 lb 5.8 oz (48.7 kg)  Vital Signs: Temp: 97.6 F (36.4 C) (07/24 1700) Temp Source: Oral (07/24 1700) BP: 129/77 mmHg (07/24 1530) Pulse Rate: 73 (07/24 1530) Intake/Output from previous day:   Intake/Output from this shift: Total I/O In: 250 [IV Piggyback:250] Out: -   Labs:  Recent Labs  05/30/15 1140  WBC 19.1*  HGB 11.5*  PLT 205  CREATININE 0.66   Estimated Creatinine Clearance: 38.1 mL/min (by C-G formula based on Cr of 0.66). No results for input(s): VANCOTROUGH, VANCOPEAK, VANCORANDOM, GENTTROUGH, GENTPEAK, GENTRANDOM, TOBRATROUGH, TOBRAPEAK, TOBRARND, AMIKACINPEAK, AMIKACINTROU, AMIKACIN in the last 72 hours.   Microbiology: Recent Results (from the past 720 hour(s))  Urine culture     Status: None   Collection Time: 05/16/15 11:15 AM  Result Value Ref Range Status   Specimen Description URINE, CATHETERIZED  Final   Special Requests Normal  Final   Culture   Final    6,000 COLONIES/mL INSIGNIFICANT GROWTH Performed at [facility]    Report Status 05/17/2015 FINAL  Final    Medical History: Past Medical History  Diagnosis Date  . Hypertension   . Dementia   . GERD (gastroesophageal reflux disease)   . DVT (deep venous thrombosis)     Medications:  Scheduled:  . docusate sodium  100 mg Oral BID  . [START ON 05/31/2015] donepezil  5 mg Oral q morning - 10a  . feeding supplement (ENSURE ENLIVE)  237 mL Oral TID BM  . irbesartan  300 mg Oral Daily   And  . hydrochlorothiazide  12.5 mg Oral Daily  . pantoprazole  40 mg Oral Daily  . piperacillin-tazobactam (ZOSYN)  IV  3.375 g Intravenous 3 times per day  . sodium chloride  3 mL Intravenous Q12H   Infusions:  . sodium chloride 20 mL/hr (05/30/15 1407)  . [START ON 05/31/2015] heparin     Assessment: Patient known to pharmacy for  heparin dosing. See anticoagulation note for full assessment. To start vancomycin per pharmacy and Zosyn ordered by Md for possible aspiration PNA vs HCAP. Afebrile. WBC elevated. SCr 0.66 with est CrCl of 39 ml/min  Goal of Therapy:  Vancomycin trough level 15-20 mcg/ml  Plan:  1) Vancomycin 1g IV x 1 given at 1436. Start  IV q24 thereafter 2) Continue current Zosyn dosing   Hessie Knows, PharmD, BCPS Pager [phone] 05/30/2015 5:26 PM

## 2015-05-30 NOTE — Progress Notes (Signed)
ANTICOAGULATION CONSULT NOTE - Initial Consult  Pharmacy Consult for IV hearpn Indication: pulmonary embolus  No Known Allergies  Patient Measurements:    Vital Signs: Temp: 97.4 F (36.3 C) (07/24 1503) Temp Source: Oral (07/24 1503) BP: 129/77 mmHg (07/24 1530) Pulse Rate: 73 (07/24 1530)  Labs:  Recent Labs  05/30/15 1140  HGB 11.5*  HCT 37.7  PLT 205  CREATININE 0.66  TROPONINI 0.09*    CrCl cannot be calculated (Unknown ideal weight.).   Medical History: Past Medical History  Diagnosis Date  . Hypertension   . Dementia   . GERD (gastroesophageal reflux disease)   . DVT (deep venous thrombosis)     Medications:  Scheduled:  . docusate sodium  100 mg Oral BID  . [START ON 05/31/2015] donepezil  5 mg Oral q morning - 10a  . feeding supplement (ENSURE ENLIVE)  237 mL Oral TID BM  . irbesartan  300 mg Oral Daily   And  . hydrochlorothiazide  12.5 mg Oral Daily  . pantoprazole  40 mg Oral Daily  . piperacillin-tazobactam (ZOSYN)  IV  3.375 g Intravenous 3 times per day  . sodium chloride  3 mL Intravenous Q12H   Infusions:  . sodium chloride 20 mL/hr (05/30/15 1407)    Assessment: 81 yo presents to ER with weakness and shortness of breath. PMH includes dementia, HTN, GERD, hx DVT. Patient recently admitted for GIB and discharge plan on 7/12 was to hold apixaban until seen by PCP who can recheck Hgb. It appears apixaban was restarted and last dose patient got was 7/21 per home med list. Given apixaban recently held, a CT of chest was done and patient now has new PE. FOB was negative today and Lovenox /kg x 1 dose was given in ER at 2pm. Now to start IV heparin per pharmacy. Will start IV heparin around time next dose of Lovenox would have been due ~2am 7/25. No heparin bolus due to Lovenox being given as well. Baseline labs stable  Goal of Therapy:  Heparin level 0.3-0.7 units/ml Monitor platelets by anticoagulation protocol: Yes   Plan:  1) Due to  Lovenox /kg being given at 2pm, will start IV heparin without a bolus at a rate of 800 units/hr 2) Will check a baseline heparin level (which should be elevated due to Lovenox being given) and aPTT just to check to make sure patient has cleared all of apixaban and that levels correlate with each other moving forward for heparin monitoring purposes 3) Check next heparin level 8 hours after IV heparin started at midnight   Hessie Knows, PharmD, BCPS Pager [phone] 05/30/2015 5:16 PM

## 2015-05-30 NOTE — ED Notes (Signed)
Unable to collect blood at this time patient is going to xray.

## 2015-05-31 ENCOUNTER — Ambulatory Visit (HOSPITAL_COMMUNITY): Payer: Medicare Other | Attending: Internal Medicine

## 2015-05-31 ENCOUNTER — Inpatient Hospital Stay (HOSPITAL_COMMUNITY): Payer: Medicare Other

## 2015-05-31 ENCOUNTER — Encounter (HOSPITAL_COMMUNITY): Payer: Self-pay | Admitting: *Deleted

## 2015-05-31 DIAGNOSIS — R52 Pain, unspecified: Secondary | ICD-10-CM

## 2015-05-31 DIAGNOSIS — I2699 Other pulmonary embolism without acute cor pulmonale: Secondary | ICD-10-CM

## 2015-05-31 DIAGNOSIS — I10 Essential (primary) hypertension: Secondary | ICD-10-CM

## 2015-05-31 LAB — COMPREHENSIVE METABOLIC PANEL
ALT: 9 U/L — AB (ref 14–54)
AST: 18 U/L (ref 15–41)
Albumin: 1.9 g/dL — ABNORMAL LOW (ref 3.5–5.0)
Alkaline Phosphatase: 91 U/L (ref 38–126)
Anion gap: 6 (ref 5–15)
BUN: 19 mg/dL (ref 6–20)
CO2: 27 mmol/L (ref 22–32)
Calcium: 8.1 mg/dL — ABNORMAL LOW (ref 8.9–10.3)
Chloride: 112 mmol/L — ABNORMAL HIGH (ref 101–111)
Creatinine, Ser: 0.47 mg/dL (ref 0.44–1.00)
GFR calc non Af Amer: >60 mL/min (ref >60)
GLUCOSE: 114 mg/dL — AB (ref 65–99)
Potassium: 2.8 mmol/L — ABNORMAL LOW (ref 3.5–5.1)
Sodium: 145 mmol/L (ref 135–145)
TOTAL PROTEIN: 5.3 g/dL — AB (ref 6.5–8.1)
Total Bilirubin: 0.6 mg/dL (ref 0.3–1.2)

## 2015-05-31 LAB — PHOSPHORUS: Phosphorus: 2.5 mg/dL (ref 2.5–4.6)

## 2015-05-31 LAB — MAGNESIUM: MAGNESIUM: 1.9 mg/dL (ref 1.7–2.4)

## 2015-05-31 LAB — PROTIME-INR
INR: 1.31 (ref 0.00–1.49)
Prothrombin Time: 16.4 seconds — ABNORMAL HIGH (ref 11.6–15.2)

## 2015-05-31 LAB — APTT
APTT: 122 s — AB (ref 24–37)
aPTT: 85 seconds — ABNORMAL HIGH (ref 24–37)

## 2015-05-31 LAB — HEPARIN LEVEL (UNFRACTIONATED)
Heparin Unfractionated: 0.58 IU/mL (ref 0.30–0.70)
Heparin Unfractionated: 0.51 IU/mL (ref 0.30–0.70)

## 2015-05-31 LAB — TSH: TSH: 0.698 u[IU]/mL (ref 0.350–4.500)

## 2015-05-31 MED ORDER — SODIUM CHLORIDE 0.9 % IV BOLUS (SEPSIS)
500.0000 mL | Freq: Once | INTRAVENOUS | Status: AC
  Administered 2015-05-31: 500 mL via INTRAVENOUS

## 2015-05-31 MED ORDER — MAGNESIUM SULFATE 2 GM/50ML IV SOLN
2.0000 g | Freq: Once | INTRAVENOUS | Status: AC
  Administered 2015-05-31: 2 g via INTRAVENOUS
  Filled 2015-05-31: qty 50

## 2015-05-31 MED ORDER — POTASSIUM CHLORIDE 20 MEQ/15ML (10%) PO SOLN
40.0000 meq | Freq: Once | ORAL | Status: AC
  Administered 2015-05-31: 40 meq via ORAL
  Filled 2015-05-31: qty 30

## 2015-05-31 MED ORDER — DOCUSATE SODIUM 100 MG PO CAPS: 100.0000 mg | ORAL_CAPSULE | Freq: Two times a day (BID) | ORAL | Status: DC

## 2015-05-31 MED ORDER — CIPROFLOXACIN IN D5W 400 MG/200ML IV SOLN
400.0000 mg | Freq: Two times a day (BID) | INTRAVENOUS | Status: DC
  Administered 2015-05-31 – 2015-06-02 (×5): 400 mg via INTRAVENOUS
  Filled 2015-05-31 (×6): qty 200

## 2015-05-31 MED ORDER — HEPARIN (PORCINE) IN NACL 100-0.45 UNIT/ML-% IJ SOLN
700.0000 [IU]/h | INTRAMUSCULAR | Status: DC
  Administered 2015-06-01: 700 [IU]/h via INTRAVENOUS
  Filled 2015-05-31: qty 250

## 2015-05-31 MED ORDER — DEXAMETHASONE SODIUM PHOSPHATE 4 MG/ML IJ SOLN
4.0000 mg | Freq: Once | INTRAMUSCULAR | Status: AC
  Administered 2015-05-31: 4 mg via INTRAVENOUS
  Filled 2015-05-31: qty 1

## 2015-05-31 MED ORDER — BOOST / RESOURCE BREEZE PO LIQD
1.0000 | Freq: Three times a day (TID) | ORAL | Status: DC
  Administered 2015-05-31 – 2015-06-04 (×9): 1 via ORAL

## 2015-05-31 NOTE — Progress Notes (Addendum)
ANTICOAGULATION CONSULT NOTE - Follow Up Consult  Pharmacy Consult for IV heparin Indication: PE, DVT  No Known Allergies  Patient Measurements: Height:  (165.1 cm) Weight: 109 lb 9.1 oz (49.7 kg) IBW/kg (Calculated) : 57 Heparin Dosing Weight: 50 kg  Vital Signs: Temp: 97.6 F (36.4 C) (07/25 1600) Temp Source: Axillary (07/25 1600) BP: 110/54 mmHg (07/25 1635) Pulse Rate: 71 (07/25 1635)  Labs:  Recent Labs  05/30/15 1140 05/30/15 2005 05/31/15 0408 05/31/15 0808 05/31/15 1603  HGB 11.5* 10.0*  --   --   --   HCT 37.7 33.5*  --   --   --   PLT 205 191  --   --   --   APTT  --  49* 85*  --  122*  LABPROT  --   --  16.4*  --   --   INR  --   --  1.31  --   --   HEPARINUNFRC  --  0.66  --  0.58 0.51  CREATININE 0.66  --  0.47  --   --   TROPONINI 0.09*  --   --   --   --     Estimated Creatinine Clearance: 38.9 mL/min (by C-G formula based on Cr of 0.47).   Medications:  Infusions:  . sodium chloride 20 mL/hr at 05/30/15 1800  . heparin      Assessment: 81 yo presents to ER with weakness and shortness of breath. PMH includes dementia, HTN, GERD, hx DVT. Patient recently admitted for GIB and discharge plan on 7/12 was to hold apixaban until seen by PCP who can recheck Hgb. It appears apixaban was restarted and last dose patient got was 7/21 per home med list. Given apixaban recently held, a CT of chest was done and patient now has new PE. FOB was negative and Lovenox /kg x 1 dose was given in ED at 2pm on 7/24, then orders received to start IV heparin per pharmacy.  Heparin drip was initiated on 7/25 at 0020 without bolus. Baseline labs stable.   Baseline INR WNL, aPTT slightly elevated.  Baseline heparin level high as expected given recent Enoxaparin dose.  Prior anticoagulation: Eliquis 5 mg daily (per patient report); last dose 7/21  Significant events: 7/25: Venous duplex + for BLE DVT  Today, 05/31/2015:  CBC: Pltc WNL; Hgb trending down (7/24  PM)  AM heparin level therapeutic on 800 units/hr; aPTT also therapeutic (note aPTT was drawn only ~ 4h after initiation of heparin, so likely not a steady state level)  No current bleeding or infusion issues per nursing; did note small amount of blood-tinged sputum overnight per RN.  CrCl: 39 ml/min  1603 HL therapeutic at 0.51, aPTT elevated at 122  Goal of Therapy: Heparin level 0.3-0.7 units/ml APTT 66-102 sec Monitor platelets by anticoagulation protocol: Yes  Plan:  Given elevated aPTT, will decrease heparin infusion to 700 units/hr.  Recheck heparin level and aPTT 8h after rate change.  With recent Eliquis, would like 2 consecutive aPTT in range before transitioning to HL only.    Daily CBC and heparin level.  Monitor for signs of bleeding.  MD aware of dropping hemoglobin and recent GI bleed.  Plan is to transfuse for Hgb < 8 and stop anticoagulation with any overt GI bleeding.   Greer Pickerel, PharmD, BCPS Pager: [phone] 05/31/2015 6:48 PM

## 2015-05-31 NOTE — Care Management Note (Signed)
Case Management Note  Patient Details  Name: Ann Kelley MRN: [id number] Date of Birth: 07-30-1926  Subjective/Objective:                 P.E. With recent histroy of a GI bleed hypotensive requiring vaso pressors.   Action/Plan:Date:  May 31, 2015 U.R. performed for needs and level of care. Will continue to follow for Case Management needs.  Marcelle Smiling, RN, BSN, [place]   [phone]   Expected Discharge Date:                  Expected Discharge Plan:  Home/Self Care  In-House Referral:  NA  Discharge planning Services  CM Consult  Post Acute Care Choice:  NA Choice offered to:  NA  DME Arranged:  N/A DME Agency:  NA  HH Arranged:  NA HH Agency:  NA  Status of Service:     Medicare Important Message Given:    Date Medicare IM Given:    Medicare IM give by:    Date Additional Medicare IM Given:    Additional Medicare Important Message give by:     If discussed at Long Length of Stay Meetings, dates discussed:    Additional Comments:  Golda Acre, RN 05/31/2015, 10:46 AM

## 2015-05-31 NOTE — Progress Notes (Signed)
  Echocardiogram 2D Echocardiogram has been performed.  Nolon Rod 05/31/2015, 10:04 AM

## 2015-05-31 NOTE — Progress Notes (Addendum)
*  Preliminary Results* Bilateral lower extremity venous duplex completed. Bilateral lower extremities are positive for deep vein thrombosis involving the right femoral, right popliteal, right posterior tibial, left posterior tibial, and left peroneal veins. There is no evidence of Baker's cyst bilaterally.  Preliminary results discussed with Adela Lank, RN and Dr. Venetia Constable.  05/31/2015 11:48 AM  Gertie Fey, RVT, RDCS, RDMS

## 2015-05-31 NOTE — Progress Notes (Addendum)
[facility] PROGRESS NOTE  Ann Kelley WUJ:[id number] DOB: 02-06-1926 DOA: 05/30/2015 PCP: Pola Corn, MD  Assessment&Care Plan at the time of admission on 05/30/15 Ann Kelley is an 81 year old female with multiple medical problems including Alzheimer's dementia/severe protein calorie malnutrition/recent acute blood loss anemia from a GI source/history of DVT/PE on Eliquis until recently, who comes in because of increasing shortness of breath associated with cough productive of yellow colored sputum for the last 3 days and she is found to have acute pulmonary embolism/healthcare associated pneumonia with CT chest showing " 1. Large pulmonary embolus in the right main pulmonary artery extending into the right upper, middle and lower lobar branches. Positive for acute PE with CT evidence of right heart strain (RV/LV Ratio = 1.17) consistent with at least submassive (intermediate risk) PE. The presence of right heart strain has been associated with an increased risk of morbidity and mortality. Please activate Code PE by paging [phone]. Critical Value/emergent results were called by telephone at the time of interpretation on 05/30/2015 at 1:27 pm to Dr. Cathren Kelley , who verbally acknowledged these results. 2. There is mild right basilar atelectasis. There is left lower lobe collapse with occlusion of the left lower lobe bronchus without a definitive obstructing mass. Recommend follow-up CT chest in 3 months". Her white count is 19,100, hemoglobin 11.5 g/dl. the question for this lady's care is whether she'll be able to tolerate anticoagulation given that she had GI bleed recently, requiring PRBC transfusion which is why she was taken off anticoagulation. I have gone over the treatment pathways for the PE in the setting of potential GI bleed with the family(her 3 daughters wanted decision-makers on her behalf), including #1 IVC filter, #2 anticoagulation and watch and wait #3 pursuing GI  workup to try to diagnose the source of GI bleeding and if treatable have it addressed and patient continue with anticoagulation. It is not clear what caused the GI bleeding to start with and the family was not interested in endoscopy at that time and they maintain the same stance now. They have elected to try anticoagulation and if patient bleeds then anticoagulation will be discontinued and they will re-assess the options although they seem inclined to do less. Pulmonary was consulted by the ED and recommended they wouldn't pursue aggressive thrombolytic therapy given patient's debility and recent GI bleed. We will therefore admit patient to the stepdown unit, follow septic workup including sputum culture/urine culture/blood cultures, start broad-spectrum antibiotic to cover for nosocomial organisms including MRSA/Pseudomonas given the fact that patient was hospitalized less than 90 days ago, start anticoagulation with heparin with plans to monitor hemoglobin and evidence of bleeding. If significant drop in hemoglobin would stop anticoagulation and confer with family regarding way forward. Will also obtain bilateral lower extremity venous Doppler/2-D echocardiogram to assess degree of right ventricular strain. At this point patient is full code. Family will continue to discuss goals of care given the fact that they have preferences that limit optimal intervention regarding treatment pathways. Subjective/Overnight developments 05/31/2015: White count increased 19,100>20,300, hemoglobin 11.5>10g/dl, potassium 2.8. Patient had borderline low BP which responded to IV fluids this morning. No overt bleeding observed. Patient slightly stronger and attempts to answer my questions. Will add ciprofloxacin, give dose of dexamethasone, replenish electrolytes and continue rest of management. Plan PE (pulmonary thromboembolism)  Keep in the stepdown unit for close cardiopulmonary monitoring  Follow Venous Doppler to  check for Dvt as patient has PE/2-D echocardiogram  IV heparin, pharmacy to dose. Appreciate  help. Once no evidence of bleeding, resume Eliquis.  Monitor hemoglobin, if significant drop will have to discontinue the heparin and consult with the family regarding further steps-? Hospice route HCAP (healthcare-associated pneumonia)/Tobacco dependency  Septic workup  Vancomycin/Zosyn/ciprofloxacin to complete 7 days of antibiotic's/nicotine patch/broncho-dilators/dose of dexamethasone Dementia/Protein-calorie malnutrition, severe  No acute changes  Resume home medications Acute blood loss anemia/Anemia, iron deficiency  Monitor hemoglobin, transfuse if PRBC less than 8 g/dL  Discontinue anticoagulation if overt GI bleeding Acute hypokalemia/hypomagnesemia  Likely related to poor intake  Replenish potassium/magnesium as needed DVT/GI Prophylaxis  On IV heparin per pharmacy  Protonix Family Communication: Discussed with patient's daughter..  Code Status   Full code  Likely DC  TBD  Consultants:  None  Procedures:  None  Antibiotics:  Vancomycin 05/30/2015>  Zosyn 05/30/2015>  Ciprofloxacin 05/31/2015>  HPI/Subjective: Feels better.  Objective: Filed Vitals:   05/31/15 0800  BP: 106/66  Pulse: 64  Temp: 97.7 F (36.5 C)  Resp: 18    Intake/Output Summary (Last 24 hours) at 05/31/15 0825 Last data filed at 05/31/15 0700  Gross per 24 hour  Intake 884.33 ml  Output    250 ml  Net 634.33 ml   Filed Weights   05/30/15 1700 05/31/15 0400  Weight: 48.7 kg (107 lb 5.8 oz) 49.7 kg (109 lb 9.1 oz)    Exam:   General:  Comfortable at rest. Weak cough.  Cardiovascular: S1-S2 normal. No murmurs. Pulse regular.  Respiratory: Good air entry bilaterally. Bilateral transmitted sounds and rhonchi.  Abdomen: Soft and nontender. Normal bowel sounds. No organomegaly.  Musculoskeletal: No pedal edema   Neurological: No acute changes  Data  Reviewed: Basic Metabolic Panel:  Recent Labs Lab 05/30/15 1140 05/31/15 0408  NA 146* 145  K 3.4* 2.8*  CL 108 112*  CO2 29 27  GLUCOSE 106* 114*  BUN 19 19  CREATININE 0.66 0.47  CALCIUM 8.7* 8.1*  MG  --  1.9  PHOS  --  2.5   Liver Function Tests:  Recent Labs Lab 05/30/15 1140 05/31/15 0408  AST 18 18  ALT 9* 9*  ALKPHOS 99 91  BILITOT 0.6 0.6  PROT 6.4* 5.3*  ALBUMIN 2.2* 1.9*   No results for input(s): LIPASE, AMYLASE in the last 168 hours. No results for input(s): AMMONIA in the last 168 hours. CBC:  Recent Labs Lab 05/30/15 1140 05/30/15 2005  WBC 19.1* 20.3*  HGB 11.5* 10.0*  HCT 37.7 33.5*  MCV 80.4 80.3  PLT 205 191   Cardiac Enzymes:  Recent Labs Lab 05/30/15 1140  TROPONINI 0.09*   BNP (last 3 results) No results for input(s): BNP in the last 8760 hours.  ProBNP (last 3 results) No results for input(s): PROBNP in the last 8760 hours.  CBG: No results for input(s): GLUCAP in the last 168 hours.  Recent Results (from the past 240 hour(s))  MRSA PCR Screening     Status: None   Collection Time: 05/30/15  6:15 PM  Result Value Ref Range Status   MRSA by PCR NEGATIVE NEGATIVE Final    Comment:        The GeneXpert MRSA Assay (FDA approved for NASAL specimens only), is one component of a comprehensive MRSA colonization surveillance program. It is not intended to diagnose MRSA infection nor to guide or monitor treatment for MRSA infections.      Studies: Dg Chest 2 View  05/30/2015   CLINICAL DATA:  Smoker with 1 day history of cough and  chest congestion. Personal history of right middle lobe pulmonary embolus in August, 2015.  EXAM: CHEST  2 VIEW  COMPARISON:  06/07/2014 and earlier, including CTA chest 06/07/2014.  FINDINGS: AP semi-erect and lateral images were obtained. Lateral image less than optimal due to overlying clothing. Cardiac silhouette mildly to moderately enlarged, unchanged. Pleuroparenchymal scarring in the left  upper lobe, unchanged. Emphysematous changes throughout both lungs, unchanged. Suboptimal inspiration accounts for atelectasis in the left lower lobe. Lungs otherwise clear. No visible pleural effusions. No pneumothorax. Osseous demineralization with old compression fracture of what I believe is the T7 vertebral body.  IMPRESSION: 1. Suboptimal inspiration accounts for mild left lower lobe atelectasis. No acute cardiopulmonary disease otherwise. 2. COPD/emphysema. Stable pleuroparenchymal scarring involving the left upper lobe. 3. Stable mild to moderate cardiomegaly without pulmonary edema.   Electronically Signed   By: Hulan Saas M.D.   On: 05/30/2015 11:43   Ct Angio Chest Pe W/cm &/or Wo Cm  05/30/2015   CLINICAL DATA:  Shortness of breath, hypoxia, at rectal bleeding recently and Eloquist was discontinued  EXAM: CT ANGIOGRAPHY CHEST WITH CONTRAST  TECHNIQUE: Multidetector CT imaging of the chest was performed using the standard protocol during bolus administration of intravenous contrast. Multiplanar CT image reconstructions and MIPs were obtained to evaluate the vascular anatomy.  CONTRAST:  OMNIPAQUE IOHEXOL 350 MG/ML SOLN  COMPARISON:  06/07/2014  FINDINGS: There is adequate opacification of the pulmonary arteries. Large pulmonary embolus in the right main pulmonary artery extending into the right upper, middle and lower lobar branches. There is dilatation of the right main pulmonary artery. The heart size is normal. There is no pericardial effusion. Coronary artery atherosclerosis involving the left main, lad, circumflex and RCA.  There is mild right basilar atelectasis. There is left lower lobe collapse with occlusion of the left lower lobe bronchus without a definitive obstructing mass. There is bilateral mild centrilobular emphysema.  There is no axillary, hilar, or mediastinal adenopathy.  There is no lytic or blastic osseous lesion.  The visualized portions of the upper abdomen are  unremarkable.  Review of the MIP images confirms the above findings.  IMPRESSION: 1. Large pulmonary embolus in the right main pulmonary artery extending into the right upper, middle and lower lobar branches. Positive for acute PE with CT evidence of right heart strain (RV/LV Ratio = 1.17) consistent with at least submassive (intermediate risk) PE. The presence of right heart strain has been associated with an increased risk of morbidity and mortality. Please activate Code PE by paging [phone]. Critical Value/emergent results were called by telephone at the time of interpretation on 05/30/2015 at 1:27 pm to Dr. Cathren Kelley , who verbally acknowledged these results. 2. There is mild right basilar atelectasis. There is left lower lobe collapse with occlusion of the left lower lobe bronchus without a definitive obstructing mass. Recommend follow-up CT chest in 3 months.   Electronically Signed   By: Elige Ko   On: 05/30/2015 13:33    Scheduled Meds: . ciprofloxacin  400 mg Intravenous Q12H  . dexamethasone  4 mg Intravenous Once  . docusate sodium  100 mg Oral BID  . donepezil  5 mg Oral q morning - 10a  . feeding supplement (ENSURE ENLIVE)  237 mL Oral TID BM  . guaiFENesin  1,200 mg Oral BID  . irbesartan  300 mg Oral Daily   And  . hydrochlorothiazide  12.5 mg Oral Daily  . magnesium sulfate 1 - 4 g bolus IVPB  2 g Intravenous Once  . nicotine  21 mg Transdermal Daily  . pantoprazole  40 mg Oral Daily  . piperacillin-tazobactam (ZOSYN)  IV  3.375 g Intravenous 3 times per day  . potassium chloride  40 mEq Oral Once  . potassium chloride  40 mEq Oral Once  . sodium chloride  3 mL Intravenous Q12H  . vancomycin  750 mg Intravenous Q24H   Continuous Infusions: . sodium chloride 20 mL/hr at 05/30/15 1800  . heparin 800 Units/hr (05/31/15 0700)     Time spent: 25 minutes    Kemi Gell  [facility] Pager [phone]. If 7PM-7AM, please contact night-coverage at  [email or web address], password [email or web address] 05/31/2015, 8:25 AM  LOS: 1 day

## 2015-05-31 NOTE — Progress Notes (Signed)
Date:  May 31, 2015 U.R. performed for needs and level of care. Will continue to follow for Case Management needs.  Rhonda Davis, RN, BSN, CCM   [phone] 

## 2015-05-31 NOTE — Progress Notes (Signed)
Initial Nutrition Assessment  DOCUMENTATION CODES:   Severe malnutrition in context of chronic illness, Underweight  INTERVENTION:   D/c Ensure Enlive Provide Boost Breeze po TID, each supplement provides 250 kcal and 9 grams of protein Provide Magic cup BID with meals, each supplement provides 290 kcal and 9 grams of protein Provide daily snacks Provided protein supplement list handout to family Encourage PO intake RD to continue to monitor  NUTRITION DIAGNOSIS:   Increased nutrient needs related to wound healing as evidenced by estimated needs.  GOAL:   Patient will meet greater than or equal to 90% of their needs  MONITOR:   PO intake, Supplement acceptance, Labs, Weight trends, Skin, I & O's  REASON FOR ASSESSMENT:   Malnutrition Screening Tool    ASSESSMENT:   81 year old female with multiple medical problems including Alzheimer's dementia/severe protein calorie malnutrition/recent acute blood loss anemia from a GI source/history of DVT/PE on Eliquis until recently, who comes in because of increasing shortness of breath associated with cough productive of yellow colored sputum for the last 3 days.  Pt in room with family at bedside. Per family, pt will drink Ensure but they cause loose stools. Family would like to try other protein options. Pt wants Boost Breeze TID. RD to order [employer] on lunch/dinner trays. RD to order pudding for daily snacks as well. Family requested a list of other protein supplement options. RD to provide.  Per weight history, pt's weight has remained stable since last admission earlier in the month. Nutrition-Focused physical exam completed. Findings are severe fat depletion, severe muscle depletion, and mild edema.   Labs reviewed: Low K Mg/Phos WNL  Diet Order:  DIET DYS 3 Room service appropriate?: Yes; Fluid consistency:: Thin  Skin:    Stage II pressure ulcer on ankle, Stage I pressure ulcer on hip, Stage III pressure ulcer on leg,  deep tissue ulcer on heel, multiple wounds  Last BM:  7/24  Height:   Ht Readings from Last 1 Encounters:  05/31/15  (1.651 m)    Weight:   Wt Readings from Last 1 Encounters:  05/31/15 109 lb 9.1 oz (49.7 kg)    Ideal Body Weight:  56.8 kg  Wt Readings from Last 10 Encounters:  05/31/15 109 lb 9.1 oz (49.7 kg)  05/16/15 107 lb 4.8 oz (48.671 kg)  06/07/14 127 lb 10.3 oz (57.9 kg)    BMI:  Body mass index is 18.23 kg/(m^2).  Estimated Nutritional Needs:   Kcal:  1500-1700  Protein:  75-85g  Fluid:  2L/day  EDUCATION NEEDS:   No education needs identified at this time  Tilda Franco, MS, RD, LDN Pager: [phone] After Hours Pager: [phone]

## 2015-05-31 NOTE — Progress Notes (Signed)
ANTICOAGULATION CONSULT NOTE - Follow Up Consult  Pharmacy Consult for IV heparin Indication: pulmonary embolus  No Known Allergies  Patient Measurements: Weight: 109 lb 9.1 oz (49.7 kg) Heparin Dosing Weight: 50 kg  Vital Signs: Temp: 97.7 F (36.5 C) (07/25 0800) Temp Source: Axillary (07/25 0800) BP: 106/66 mmHg (07/25 0800) Pulse Rate: 64 (07/25 0800)  Labs:  Recent Labs  05/30/15 1140 05/30/15 2005 05/31/15 0408 05/31/15 0808  HGB 11.5* 10.0*  --   --   HCT 37.7 33.5*  --   --   PLT 205 191  --   --   APTT  --  49* 85*  --   LABPROT  --   --  16.4*  --   INR  --   --  1.31  --   HEPARINUNFRC  --  0.66  --  0.58  CREATININE 0.66  --  0.47  --   TROPONINI 0.09*  --   --   --     Estimated Creatinine Clearance: 38.9 mL/min (by C-G formula based on Cr of 0.47).   Medications:  Infusions:  . sodium chloride 20 mL/hr at 05/30/15 1800  . heparin 800 Units/hr (05/31/15 0700)    Assessment: 81 yo presents to ER with weakness and shortness of breath. PMH includes dementia, HTN, GERD, hx DVT. Patient recently admitted for GIB and discharge plan on 7/12 was to hold apixaban until seen by PCP who can recheck Hgb. It appears apixaban was restarted and last dose patient got was 7/21 per home med list. Given apixaban recently held, a CT of chest was done and patient now has new PE. FOB was negative today and Lovenox /kg x 1 dose was given in ER at 2pm. Now to start IV heparin per pharmacy. Will start IV heparin around time next dose of Lovenox would have been due ~2am 7/25. No heparin bolus due to Lovenox being given as well. Baseline labs stable.  Assessment:   Baseline INR wnl, aPTT sl elevated.  Baseline heparin level high as expected  Prior anticoagulation: Eliquis 5 mg daily (per patient report); last dose 7/21  Significant events:  Today, 05/31/2015:  CBC: Plt wnl; Hgb dropping  Most recent heparin level therapeutic on 800 units/hr; aPTT also  therapeutic  No current bleeding or infusion issues per nursing; did note some blood in sputum overnight.  CrCl: 39 ml/min  Goal of Therapy: Heparin level 0.3-0.7 units/ml APTT 66-102 sec Monitor platelets by anticoagulation protocol: Yes  Plan:  Continue heparin IV infusion at 800 units/hr.  Recheck heparin level and aPTT in 8 hrs.  With recent Lovenox/Eliquis, want 2 consecutive aPTT in range before transitioning to HL only.    Daily CBC and heparin level.  Monitor for signs of bleeding or thrombosis.  MD aware of dropping hemoglobin and recent GI bleed.  Plan is to transfuse for Hgb < 8 and stop anticoagulation with any overt GI bleeding   Bernadene Person, PharmD, BCPS Pager: [phone] 05/31/2015, 9:13 AM

## 2015-06-01 DIAGNOSIS — I071 Rheumatic tricuspid insufficiency: Secondary | ICD-10-CM | POA: Diagnosis present

## 2015-06-01 LAB — PHOSPHORUS: Phosphorus: 2.6 mg/dL (ref 2.5–4.6)

## 2015-06-01 LAB — COMPREHENSIVE METABOLIC PANEL
ALK PHOS: 87 U/L (ref 38–126)
ALT: 10 U/L — AB (ref 14–54)
AST: 17 U/L (ref 15–41)
Albumin: 1.8 g/dL — ABNORMAL LOW (ref 3.5–5.0)
Anion gap: 7 (ref 5–15)
BILIRUBIN TOTAL: 0.5 mg/dL (ref 0.3–1.2)
BUN: 18 mg/dL (ref 6–20)
CO2: 24 mmol/L (ref 22–32)
Calcium: 8.1 mg/dL — ABNORMAL LOW (ref 8.9–10.3)
Chloride: 110 mmol/L (ref 101–111)
Creatinine, Ser: 0.36 mg/dL — ABNORMAL LOW (ref 0.44–1.00)
GFR calc Af Amer: >60 mL/min (ref >60)
GLUCOSE: 114 mg/dL — AB (ref 65–99)
Potassium: 4.1 mmol/L (ref 3.5–5.1)
SODIUM: 141 mmol/L (ref 135–145)
Total Protein: 5.4 g/dL — ABNORMAL LOW (ref 6.5–8.1)

## 2015-06-01 LAB — CBC WITH DIFFERENTIAL/PLATELET
Basophils Absolute: 0 K/uL (ref 0.0–0.1)
Basophils Relative: 0 % (ref 0–1)
Eosinophils Absolute: 0 K/uL (ref 0.0–0.7)
Eosinophils Relative: 0 % (ref 0–5)
HCT: 31.3 % — ABNORMAL LOW (ref 36.0–46.0)
Hemoglobin: 9.2 g/dL — ABNORMAL LOW (ref 12.0–15.0)
Lymphocytes Relative: 4 % — ABNORMAL LOW (ref 12–46)
Lymphs Abs: 0.5 K/uL — ABNORMAL LOW (ref 0.7–4.0)
MCH: 23.7 pg — ABNORMAL LOW (ref 26.0–34.0)
MCHC: 29.4 g/dL — ABNORMAL LOW (ref 30.0–36.0)
MCV: 80.5 fL (ref 78.0–100.0)
Monocytes Absolute: 0.9 K/uL (ref 0.1–1.0)
Monocytes Relative: 7 % (ref 3–12)
Neutro Abs: 11.5 K/uL — ABNORMAL HIGH (ref 1.7–7.7)
Neutrophils Relative %: 89 % — ABNORMAL HIGH (ref 43–77)
Platelets: 173 K/uL (ref 150–400)
RBC: 3.89 MIL/uL (ref 3.87–5.11)
RDW: 28.1 % — ABNORMAL HIGH (ref 11.5–15.5)
WBC: 12.9 K/uL — ABNORMAL HIGH (ref 4.0–10.5)

## 2015-06-01 LAB — URINE CULTURE: Culture: NO GROWTH

## 2015-06-01 LAB — HEPARIN LEVEL (UNFRACTIONATED)
HEPARIN UNFRACTIONATED: 0.31 [IU]/mL (ref 0.30–0.70)
Heparin Unfractionated: 0.36 IU/mL (ref 0.30–0.70)

## 2015-06-01 LAB — APTT: APTT: 118 s — AB (ref 24–37)

## 2015-06-01 LAB — MAGNESIUM: MAGNESIUM: 2.2 mg/dL (ref 1.7–2.4)

## 2015-06-01 MED ORDER — PREDNISONE 20 MG PO TABS
40.0000 mg | ORAL_TABLET | Freq: Every day | ORAL | Status: DC
  Administered 2015-06-02 – 2015-06-03 (×2): 40 mg via ORAL
  Filled 2015-06-01 (×2): qty 2

## 2015-06-01 MED ORDER — APIXABAN 2.5 MG PO TABS
10.0000 mg | ORAL_TABLET | Freq: Two times a day (BID) | ORAL | Status: DC
  Administered 2015-06-01 – 2015-06-04 (×7): 10 mg via ORAL
  Filled 2015-06-01 (×3): qty 4
  Filled 2015-06-01: qty 2
  Filled 2015-06-01 (×3): qty 4

## 2015-06-01 MED ORDER — APIXABAN 2.5 MG PO TABS: 5.0000 mg | ORAL_TABLET | Freq: Two times a day (BID) | ORAL | Status: DC

## 2015-06-01 NOTE — Progress Notes (Signed)
[facility]  Patient Status: Active (receiving services up to time of hospitalization)  [facility] is providing the following services: RN and ST  If patient discharges after hours, please call [phone].   [name] 06/01/2015, 9:20 AM

## 2015-06-01 NOTE — Progress Notes (Signed)
ANTICOAGULATION CONSULT NOTE - Follow Up Consult  Pharmacy Consult for Heparin Indication: pulmonary embolus and DVT  No Known Allergies  Patient Measurements: Height:  (165.1 cm) Weight: 109 lb 9.1 oz (49.7 kg) IBW/kg (Calculated) : 57 Heparin Dosing Weight:   Vital Signs: Temp: 97.7 F (36.5 C) (07/26 0327) Temp Source: Oral (07/26 0327) BP: 106/59 mmHg (07/26 0200) Pulse Rate: 67 (07/26 0200)  Labs:  Recent Labs  05/30/15 1140  05/30/15 2005 05/31/15 0408 05/31/15 0808 05/31/15 1603 06/01/15 0220  HGB 11.5*  --  10.0*  --   --   --  9.2*  HCT 37.7  --  33.5*  --   --   --  31.3*  PLT 205  --  191  --   --   --  173  APTT  --   < > 49* 85*  --  122* 118*  LABPROT  --   --   --  16.4*  --   --   --   INR  --   --   --  1.31  --   --   --   HEPARINUNFRC  --   < > 0.66  --  0.58 0.51 0.36  CREATININE 0.66  --   --  0.47  --   --  0.36*  TROPONINI 0.09*  --   --   --   --   --   --   < > = values in this interval not displayed.  Estimated Creatinine Clearance: 38.9 mL/min (by C-G formula based on Cr of 0.36).   Medications:  Infusions:  . sodium chloride 20 mL/hr at 05/30/15 1800  . heparin 700 Units/hr (05/31/15 1802)    Assessment: Patient with heparin level at goal.  PTT still elevated.  Pharmacy was checking both due to know interaction of apixaban to falsely elevate anti-Xa levels (aka heparin levels).  Since heparin levels are within range (no sign of false elevation) and PTT is above range, will going forward only use heparin levels to monitor heparin.  Goal of Therapy:  Heparin level 0.3-0.7 units/ml Monitor platelets by anticoagulation protocol: Yes   Plan:  Continue heparin drip at current rate Recheck level at 1000  Darlina Guys, Morgan Crowford 06/01/2015,5:03 AM

## 2015-06-01 NOTE — Care Management Important Message (Signed)
Important Message  Patient Details  Name: Ann Kelley MRN: [id number] Date of Birth: Mar 30, 1926   Medicare Important Message Given:  Yes-second notification given    Haskell Flirt 06/01/2015, 2:04 PMImportant Message  Patient Details  Name: Ann Kelley MRN: [id number] Date of Birth: Mar 22, 1926   Medicare Important Message Given:  Yes-second notification given    Haskell Flirt 06/01/2015, 2:04 PM

## 2015-06-01 NOTE — Progress Notes (Signed)
ANTICOAGULATION CONSULT NOTE - Follow Up Consult  Pharmacy Consult for IV heparin >> Apixaban Indication: PE, DVT  No Known Allergies  Patient Measurements: Height:  (165.1 cm) Weight: 109 lb 9.1 oz (49.7 kg) IBW/kg (Calculated) : 57  Vital Signs: Temp: 97.6 F (36.4 C) (07/26 0800) Temp Source: Oral (07/26 0800) BP: 145/89 mmHg (07/26 0800) Pulse Rate: 65 (07/26 1000)  Labs:  Recent Labs  05/30/15 1140  05/30/15 2005 05/31/15 0408  05/31/15 1603 06/01/15 0220 06/01/15 0830  HGB 11.5*  --  10.0*  --   --   --  9.2*  --   HCT 37.7  --  33.5*  --   --   --  31.3*  --   PLT 205  --  191  --   --   --  173  --   APTT  --   < > 49* 85*  --  122* 118*  --   LABPROT  --   --   --  16.4*  --   --   --   --   INR  --   --   --  1.31  --   --   --   --   HEPARINUNFRC  --   --  0.66  --   < > 0.51 0.36 0.31  CREATININE 0.66  --   --  0.47  --   --  0.36*  --   TROPONINI 0.09*  --   --   --   --   --   --   --   < > = values in this interval not displayed.  Estimated Creatinine Clearance: 38.9 mL/min (by C-G formula based on Cr of 0.36).   Medications:  Infusions:  . sodium chloride 20 mL/hr at 06/01/15 0300    Assessment: 81 yo presents to ER with weakness and shortness of breath. PMH includes dementia, HTN, GERD, hx DVT. Patient recently admitted for GIB and discharge plan on 7/12 was to hold apixaban until seen by PCP who can recheck Hgb. It appears apixaban was restarted and last dose patient got was 7/21 per home med list. Given apixaban recently held, a CT of chest was done and patient now has new PE. FOB was negative and Lovenox /kg x 1 dose was given in ED at 2pm on 7/24, then orders received to start IV heparin per pharmacy.  Heparin continued through 7/26, then patient to transition back to apixaban per pharmacy.   Baseline INR WNL, aPTT slightly elevated.  Baseline heparin level high as expected given recent Enoxaparin dose.  Prior anticoagulation: Eliquis  5 mg daily (per patient report); last dose 7/21  Significant events: 7/25: Venous duplex + for BLE DVT  Today, 06/01/2015:  CBC: Pltc WNL; Hgb trending down but fairly close to baseline values  CrCl: 39 ml/min  No reports of bleeding overnight  No interacting medications noted  Goal of Therapy: Monitor platelets by anticoagulation protocol: Yes  Plan:  Discontinue heparin IV  Begin apixaban 10 mg PO bid x 5 days, then 5 mg PO bid.  No dose reductions for weight/age/SCr with VTE dosing, but note that patient is 81 and 50 kg.  Given this as well as recent GIB, would opt to anticoagulate conservatively.  Will shorten initial phase from recommended 7 days to 5 days as patient was on IV heparin for 2 days.  May also consider reducing dose to 2.5 mg bid (prophylaxis)  sooner than the  recommended 6 months.  Regardless of anticoagulation strategy, apixaban should be dosed bid, NOT daily.  CBC at least every 72 hr while on anticoagulation  Monitor for signs of bleeding.  MD aware of dropping hemoglobin and recent GI bleed.  Plan is to transfuse for Hgb < 8 and stop anticoagulation with any overt GI bleeding.   Bernadene Person, PharmD, BCPS Pager: [phone] 06/01/2015, 11:25 AM

## 2015-06-01 NOTE — Progress Notes (Signed)
[facility] PROGRESS NOTE  Ann Kelley ZOX:[id number] DOB: 21-Apr-1926 DOA: 05/30/2015 PCP: Pola Corn, MD  Assessment&Care Plan at the time of admission on 05/30/15 Ann Kelley is an 81 year old female with multiple medical problems including Alzheimer's dementia/severe protein calorie malnutrition/recent acute blood loss anemia from a GI source/history of DVT/PE on Eliquis until recently, who comes in because of increasing shortness of breath associated with cough productive of yellow colored sputum for the last 3 days and she is found to have acute pulmonary embolism/healthcare associated pneumonia with CT chest showing " 1. Large pulmonary embolus in the right main pulmonary artery extending into the right upper, middle and lower lobar branches. Positive for acute PE with CT evidence of right heart strain (RV/LV Ratio = 1.17) consistent with at least submassive (intermediate risk) PE. The presence of right heart strain has been associated with an increased risk of morbidity and mortality. Please activate Code PE by paging [phone]. Critical Value/emergent results were called by telephone at the time of interpretation on 05/30/2015 at 1:27 pm to Dr. Cathren Laine , who verbally acknowledged these results. 2. There is mild right basilar atelectasis. There is left lower lobe collapse with occlusion of the left lower lobe bronchus without a definitive obstructing mass. Recommend follow-up CT chest in 3 months". Her white count is 19,100, hemoglobin 11.5 g/dl. the question for this lady's care is whether she'll be able to tolerate anticoagulation given that she had GI bleed recently, requiring PRBC transfusion which is why she was taken off anticoagulation. I have gone over the treatment pathways for the PE in the setting of potential GI bleed with the family(her 3 daughters wanted decision-makers on her behalf), including #1 IVC filter, #2 anticoagulation and watch and wait #3 pursuing GI  workup to try to diagnose the source of GI bleeding and if treatable have it addressed and patient continue with anticoagulation. It is not clear what caused the GI bleeding to start with and the family was not interested in endoscopy at that time and they maintain the same stance now. They have elected to try anticoagulation and if patient bleeds then anticoagulation will be discontinued and they will re-assess the options although they seem inclined to do less. Pulmonary was consulted by the ED and recommended they wouldn't pursue aggressive thrombolytic therapy given patient's debility and recent GI bleed. We will therefore admit patient to the stepdown unit, follow septic workup including sputum culture/urine culture/blood cultures, start broad-spectrum antibiotic to cover for nosocomial organisms including MRSA/Pseudomonas given the fact that patient was hospitalized less than 90 days ago, start anticoagulation with heparin with plans to monitor hemoglobin and evidence of bleeding. If significant drop in hemoglobin would stop anticoagulation and confer with family regarding way forward. Will also obtain bilateral lower extremity venous Doppler/2-D echocardiogram to assess degree of right ventricular strain. At this point patient is full code. Family will continue to discuss goals of care given the fact that they have preferences that limit optimal intervention regarding treatment pathways. Subjective/Overnight developments 05/31/2015: White count increased 19,100>20,300, hemoglobin 11.5>10g/dl, potassium 2.8. Patient had borderline low BP which responded to IV fluids this morning. No overt bleeding observed. Patient slightly stronger and attempts to answer my questions. Will add ciprofloxacin, give dose of dexamethasone, replenish electrolytes and continue rest of management. 06/01/15: White count improved 19,100>20,300>12,900, Hb11.5>10>9.2g/dl, venous Doppler showed "Bilateral lower extremity venous duplex  completed. Bilateral lower extremities are positive for deep vein thrombosis involving the right femoral, right popliteal, right posterior tibial, left  posterior tibial, and left peroneal veins. There is no evidence of Baker's cyst bilaterally", 2-D echo showed a normal ejection fraction and mild to moderate tricuspid regurgitation. There is no report of overt bleeding. Patient is more with it with less cough. Will transition her to Eliquis per pharmacy, continue current antibiotics pending final septic workup results, change steroid-induced to prednisone and transfer to telemetry. Plan PE (pulmonary thromboembolism)  Discontinue IV heparin. Resume Eliquis.  Monitor hemoglobin, if significant drop will have to discontinue the heparin and consult with the family regarding further steps-? Hospice route HCAP (healthcare-associated pneumonia)/Tobacco dependency  Follow Septic workup  Day 3 Vancomycin/Zosyn/ciprofloxacin to complete 7 days of antibiotic's/nicotine patch/broncho-dilators/prednisone. ? Start de-escalation of antibiotics in the next day or 2 her septic workup remains unremarkable and white count continues to normalize. Dementia/Protein-calorie malnutrition, severe  No acute changes  Continue home medications Acute blood loss anemia/Anemia, iron deficiency  Monitor hemoglobin, transfuse if PRBC less than 8 g/dL  Discontinue anticoagulation if overt GI bleeding Acute hypokalemia/hypomagnesemia  Likely related to poor intake  Replenish potassium/magnesium as needed Pressure ulcers both heels present at admission, stage II. Clean.  Supportive care DVT/GI Prophylaxis  On Eliquis  Protonix Family Communication: Discussed with patient's daughter.  Code Status   Full code  Likely DC  TBD-likely home  Consultants:  None  Procedures:  None  Antibiotics:  Vancomycin 05/30/2015>  Zosyn 05/30/2015>  Ciprofloxacin 05/31/2015>  HPI/Subjective: Feels better. No  complaints.  Objective: Filed Vitals:   06/01/15 0800  BP: 145/89  Pulse: 59  Temp: 97.6 F (36.4 C)  Resp: 18    Intake/Output Summary (Last 24 hours) at 06/01/15 0926 Last data filed at 06/01/15 0800  Gross per 24 hour  Intake   1421 ml  Output    304 ml  Net   1117 ml   Filed Weights   05/30/15 1700 05/31/15 0400  Weight: 48.7 kg (107 lb 5.8 oz) 49.7 kg (109 lb 9.1 oz)    Exam:   General:  Comfortable at rest. More with it.  Cardiovascular: S1-S2 normal. No murmurs. Pulse regular.  Respiratory: Good air entry bilaterally. No rhonchi or rales.  Abdomen: Soft and nontender. Normal bowel sounds. No organomegaly.  Musculoskeletal: No pedal edema   Neurological: No acute changes  Data Reviewed: Basic Metabolic Panel:  Recent Labs Lab 05/30/15 1140 05/31/15 0408 06/01/15 0220  NA 146* 145 141  K 3.4* 2.8* 4.1  CL 108 112* 110  CO2 29 27 24   GLUCOSE 106* 114* 114*  BUN 19 19 18   CREATININE 0.66 0.47 0.36*  CALCIUM 8.7* 8.1* 8.1*  MG  --  1.9 2.2  PHOS  --  2.5 2.6   Liver Function Tests:  Recent Labs Lab 05/30/15 1140 05/31/15 0408 06/01/15 0220  AST 18 18 17   ALT 9* 9* 10*  ALKPHOS 99 91 87  BILITOT 0.6 0.6 0.5  PROT 6.4* 5.3* 5.4*  ALBUMIN 2.2* 1.9* 1.8*   No results for input(s): LIPASE, AMYLASE in the last 168 hours. No results for input(s): AMMONIA in the last 168 hours. CBC:  Recent Labs Lab 05/30/15 1140 05/30/15 2005 06/01/15 0220  WBC 19.1* 20.3* 12.9*  NEUTROABS  --   --  11.5*  HGB 11.5* 10.0* 9.2*  HCT 37.7 33.5* 31.3*  MCV 80.4 80.3 80.5  PLT 205 191 173   Cardiac Enzymes:  Recent Labs Lab 05/30/15 1140  TROPONINI 0.09*   BNP (last 3 results) No results for input(s): BNP in the  last 8760 hours.  ProBNP (last 3 results) No results for input(s): PROBNP in the last 8760 hours.  CBG: No results for input(s): GLUCAP in the last 168 hours.  Recent Results (from the past 240 hour(s))  MRSA PCR Screening      Status: None   Collection Time: 05/30/15  6:15 PM  Result Value Ref Range Status   MRSA by PCR NEGATIVE NEGATIVE Final    Comment:        The GeneXpert MRSA Assay (FDA approved for NASAL specimens only), is one component of a comprehensive MRSA colonization surveillance program. It is not intended to diagnose MRSA infection nor to guide or monitor treatment for MRSA infections.      Studies: Dg Chest 2 View  05/30/2015   CLINICAL DATA:  Smoker with 1 day history of cough and chest congestion. Personal history of right middle lobe pulmonary embolus in August, 2015.  EXAM: CHEST  2 VIEW  COMPARISON:  06/07/2014 and earlier, including CTA chest 06/07/2014.  FINDINGS: AP semi-erect and lateral images were obtained. Lateral image less than optimal due to overlying clothing. Cardiac silhouette mildly to moderately enlarged, unchanged. Pleuroparenchymal scarring in the left upper lobe, unchanged. Emphysematous changes throughout both lungs, unchanged. Suboptimal inspiration accounts for atelectasis in the left lower lobe. Lungs otherwise clear. No visible pleural effusions. No pneumothorax. Osseous demineralization with old compression fracture of what I believe is the T7 vertebral body.  IMPRESSION: 1. Suboptimal inspiration accounts for mild left lower lobe atelectasis. No acute cardiopulmonary disease otherwise. 2. COPD/emphysema. Stable pleuroparenchymal scarring involving the left upper lobe. 3. Stable mild to moderate cardiomegaly without pulmonary edema.   Electronically Signed   By: Hulan Saas M.D.   On: 05/30/2015 11:43   Ct Angio Chest Pe W/cm &/or Wo Cm  05/30/2015   CLINICAL DATA:  Shortness of breath, hypoxia, at rectal bleeding recently and Eloquist was discontinued  EXAM: CT ANGIOGRAPHY CHEST WITH CONTRAST  TECHNIQUE: Multidetector CT imaging of the chest was performed using the standard protocol during bolus administration of intravenous contrast. Multiplanar CT image  reconstructions and MIPs were obtained to evaluate the vascular anatomy.  CONTRAST:  OMNIPAQUE IOHEXOL 350 MG/ML SOLN  COMPARISON:  06/07/2014  FINDINGS: There is adequate opacification of the pulmonary arteries. Large pulmonary embolus in the right main pulmonary artery extending into the right upper, middle and lower lobar branches. There is dilatation of the right main pulmonary artery. The heart size is normal. There is no pericardial effusion. Coronary artery atherosclerosis involving the left main, lad, circumflex and RCA.  There is mild right basilar atelectasis. There is left lower lobe collapse with occlusion of the left lower lobe bronchus without a definitive obstructing mass. There is bilateral mild centrilobular emphysema.  There is no axillary, hilar, or mediastinal adenopathy.  There is no lytic or blastic osseous lesion.  The visualized portions of the upper abdomen are unremarkable.  Review of the MIP images confirms the above findings.  IMPRESSION: 1. Large pulmonary embolus in the right main pulmonary artery extending into the right upper, middle and lower lobar branches. Positive for acute PE with CT evidence of right heart strain (RV/LV Ratio = 1.17) consistent with at least submassive (intermediate risk) PE. The presence of right heart strain has been associated with an increased risk of morbidity and mortality. Please activate Code PE by paging [phone]. Critical Value/emergent results were called by telephone at the time of interpretation on 05/30/2015 at 1:27 pm to Dr. Cathren Laine ,  who verbally acknowledged these results. 2. There is mild right basilar atelectasis. There is left lower lobe collapse with occlusion of the left lower lobe bronchus without a definitive obstructing mass. Recommend follow-up CT chest in 3 months.   Electronically Signed   By: Elige Ko   On: 05/30/2015 13:33    Scheduled Meds: . apixaban  10 mg Oral BID  . [START ON 06/06/2015] apixaban  5 mg  Oral BID  . ciprofloxacin  400 mg Intravenous Q12H  . donepezil  5 mg Oral q morning - 10a  . feeding supplement  1 Container Oral TID BM  . guaiFENesin  1,200 mg Oral BID  . irbesartan  300 mg Oral Daily   And  . hydrochlorothiazide  12.5 mg Oral Daily  . nicotine  21 mg Transdermal Daily  . pantoprazole  40 mg Oral Daily  . piperacillin-tazobactam (ZOSYN)  IV  3.375 g Intravenous 3 times per day  . [START ON 06/02/2015] predniSONE  40 mg Oral Q breakfast  . sodium chloride  3 mL Intravenous Q12H  . vancomycin  750 mg Intravenous Q24H   Continuous Infusions: . sodium chloride 20 mL/hr at 06/01/15 0300     Time spent: 25 minutes    Ann Kelley  [facility] Pager [phone]. If 7PM-7AM, please contact night-coverage at [email or web address], password [email or web address] 06/01/2015, 9:26 AM  LOS: 2 days

## 2015-06-01 NOTE — Clinical Documentation Improvement (Signed)
MD's, NP's, and PA's   Per nursing assessment patient has pressure ulcers on Left hip stage 1, Left ankle stage II and a stage III on left leg, if these diagnosis are appropriate for this admission please document in notes and discharge summary. Thank you   Medicare rules require specification as to whether an inpatient diagnosis was present at the time of admission.    Please clarify if the following diagnosis Left Hip stage 1/ left ankle stage II/ and left leg stage III pressure ulcers were all:     Marland Kitchen Present at the time of admission . NOT present at the time of inpatient admission and it developed during the inpatient stay . Unable to clinically determine whether the condition was present on admission. . Documentation insufficient to determine if condition was present at the time of inpatient admission  Thank You, Lavonda Jumbo ,RN Clinical Documentation Specialist:  [phone]  [facility]- Health Information Management

## 2015-06-02 DIAGNOSIS — D62 Acute posthemorrhagic anemia: Secondary | ICD-10-CM

## 2015-06-02 DIAGNOSIS — E876 Hypokalemia: Secondary | ICD-10-CM

## 2015-06-02 DIAGNOSIS — I2699 Other pulmonary embolism without acute cor pulmonale: Secondary | ICD-10-CM

## 2015-06-02 LAB — BASIC METABOLIC PANEL
Anion gap: 12 (ref 5–15)
BUN: 15 mg/dL (ref 6–20)
CO2: 22 mmol/L (ref 22–32)
CREATININE: 0.44 mg/dL (ref 0.44–1.00)
Calcium: 8.5 mg/dL — ABNORMAL LOW (ref 8.9–10.3)
Chloride: 106 mmol/L (ref 101–111)
GFR calc Af Amer: >60 mL/min (ref >60)
GFR calc non Af Amer: >60 mL/min (ref >60)
Glucose, Bld: 65 mg/dL (ref 65–99)
Potassium: 3.4 mmol/L — ABNORMAL LOW (ref 3.5–5.1)
SODIUM: 140 mmol/L (ref 135–145)

## 2015-06-02 LAB — CBC
HCT: 34.7 % — ABNORMAL LOW (ref 36.0–46.0)
HEMOGLOBIN: 10.5 g/dL — AB (ref 12.0–15.0)
MCH: 24.1 pg — ABNORMAL LOW (ref 26.0–34.0)
MCHC: 30.3 g/dL (ref 30.0–36.0)
MCV: 79.8 fL (ref 78.0–100.0)
PLATELETS: 178 10*3/uL (ref 150–400)
RBC: 4.35 MIL/uL (ref 3.87–5.11)
RDW: 27.4 % — ABNORMAL HIGH (ref 11.5–15.5)
WBC: 14.7 10*3/uL — ABNORMAL HIGH (ref 4.0–10.5)

## 2015-06-02 MED ORDER — SACCHAROMYCES BOULARDII 250 MG PO CAPS
250.0000 mg | ORAL_CAPSULE | Freq: Two times a day (BID) | ORAL | Status: DC
  Administered 2015-06-02 – 2015-06-04 (×4): 250 mg via ORAL
  Filled 2015-06-02 (×4): qty 1

## 2015-06-02 MED ORDER — POTASSIUM CHLORIDE CRYS ER 20 MEQ PO TBCR
40.0000 meq | EXTENDED_RELEASE_TABLET | Freq: Once | ORAL | Status: AC
  Administered 2015-06-02: 40 meq via ORAL
  Filled 2015-06-02: qty 2

## 2015-06-02 MED ORDER — SODIUM CHLORIDE 0.9 % IV SOLN
1.5000 g | Freq: Four times a day (QID) | INTRAVENOUS | Status: DC
  Administered 2015-06-02 – 2015-06-04 (×7): 1.5 g via INTRAVENOUS
  Filled 2015-06-02 (×10): qty 1.5

## 2015-06-02 NOTE — Discharge Instructions (Signed)
Nutrition Post Hospital Stay Proper nutrition can help your body recover from illness and injury.   Foods and beverages high in protein, vitamins, and minerals help rebuild muscle loss, promote healing, & reduce fall risk.   In addition to eating healthy foods, a nutrition shake is an easy, delicious way to get the nutrition you need during and after your hospital stay  It is recommended that you continue to drink 2 bottles per day of:       Boost Breeze for at least 1 month (30 days) after your hospital stay   Tips for adding a nutrition shake into your routine: As allowed, drink one with vitamins or medications instead of water or juice Enjoy one as a tasty mid-morning or afternoon snack Drink cold or make a milkshake out of it Drink one instead of milk with cereal or snacks Use as a coffee creamer   Available at the following grocery stores and pharmacies:           * Karin Golden * [employer] * [employer]  * [employer]          * [employer] * [employer]  * [facility]      * Target  * [employer]'s   * [facility]  * [employer]   * [facility] [phone]            For COUPONS visit: [email or web address]/join or [email or web address]   Suggested Substitutions Ensure Plus = Boost Plus = Carnation Breakfast Essentials = Boost Compact Ensure Active Clear = Boost Breeze Glucerna Shake = Boost Glucose Control = Carnation Breakfast Essentials SUGAR FREE    Information on my medicine - ELIQUIS (apixaban)  This medication education was reviewed with me or my healthcare representative as part of my discharge preparation.  The pharmacist that spoke with me during my hospital stay was:  Dannielle Huh, [facility]  Why was Eliquis prescribed for you? Eliquis was prescribed to treat blood clots that may have been found in the veins of your legs (deep vein thrombosis) or in your lungs (pulmonary embolism) and to reduce the risk of them occurring again.  What do You need to know about  Eliquis ? The starting dose is 10 mg (two 5 mg tablets) taken TWICE daily for the FIRST SEVEN (7) DAYS, then on Sunday June 06, 2015  the dose is reduced to ONE 5 mg tablet taken TWICE daily.  Eliquis may be taken with or without food.   Try to take the dose about the same time in the morning and in the evening. If you have difficulty swallowing the tablet whole please discuss with your pharmacist how to take the medication safely.  Take Eliquis exactly as prescribed and DO NOT stop taking Eliquis without talking to the doctor who prescribed the medication.  Stopping may increase your risk of developing a new blood clot.  Refill your prescription before you run out.  After discharge, you should have regular check-up appointments with your healthcare provider that is prescribing your Eliquis.    What do you do if you miss a dose? If a dose of ELIQUIS is not taken at the scheduled time, take it as soon as possible on the same day and twice-daily administration should be resumed. The dose should not be doubled to make up for a missed dose.  Important Safety Information A possible side effect of Eliquis is bleeding. You should call your healthcare provider right away if you experience any of the  following: ? Bleeding from an injury or your nose that does not stop. ? Unusual colored urine (red or dark brown) or unusual colored stools (red or black). ? Unusual bruising for unknown reasons. ? A serious fall or if you hit your head (even if there is no bleeding).  Some medicines may interact with Eliquis and might increase your risk of bleeding or clotting while on Eliquis. To help avoid this, consult your healthcare provider or pharmacist prior to using any new prescription or non-prescription medications, including herbals, vitamins, non-steroidal anti-inflammatory drugs (NSAIDs) and supplements.  This website has more information on Eliquis (apixaban):  http://www.eliquis.com/eliquis/home

## 2015-06-02 NOTE — Progress Notes (Signed)
PT Cancellation Note / Screen  Patient Details Name: Ann Kelley MRN: [id number] DOB: 10-Jul-1926   Cancelled Treatment:    Reason Eval/Treat Not Completed: PT screened, no needs identified, will sign off Per last admission PT note earlier this month, "daughter did not feel pt could benefit from PT. Pt is bed bound and hasn't been OOB in at least a year per daughter."  Daughter not currently present.  RN also reports +2 for bed mobility.  Pt does not appear appropriate for skilled PT at this time.    Ann Kelley,KATHrine E 06/02/2015, 1:50 PM Zenovia Jarred, PT, DPT 06/02/2015 Pager: [phone]

## 2015-06-02 NOTE — Progress Notes (Signed)
[facility] PROGRESS NOTE  Winter Jocelyn ZOX:[id number] DOB: September 09, 1926 DOA: 05/30/2015 PCP: Pola Corn, MD  Brief Summary  Zamantha Strebel is an 81 year old female with multiple medical problems including Alzheimer's dementia/severe protein calorie malnutrition/recent acute blood loss anemia from a GI source/history of DVT/PE on Eliquis until recently, who comes in because of increasing shortness of breath associated with cough productive of yellow colored sputum for the last 3 days and she is found to have acute pulmonary embolism/healthcare associated pneumonia with CT chest showing " 1. Large pulmonary embolus in the right main pulmonary artery extending into the right upper, middle and lower lobar branches. Positive for acute PE with CT evidence of right heart strain (RV/LV Ratio = 1.17) consistent with at least submassive (intermediate risk) PE. The presence of right heart strain has been associated with an increased risk of morbidity and mortality. Please activate Code PE by paging [phone]. Critical Value/emergent results were called by telephone at the time of interpretation on 05/30/2015 at 1:27 pm to Dr. Cathren Laine , who verbally acknowledged these results. 2. There is mild right basilar atelectasis. There is left lower lobe collapse with occlusion of the left lower lobe bronchus without a definitive obstructing mass. Recommend follow-up CT chest in 3 months". Her white count is 19,100, hemoglobin 11.5 g/dl. the question for this lady's care is whether she'll be able to tolerate anticoagulation given that she had GI bleed recently, requiring PRBC transfusion which is why she was taken off anticoagulation. I have gone over the treatment pathways for the PE in the setting of potential GI bleed with the family(her 3 daughters wanted decision-makers on her behalf), including #1 IVC filter, #2 anticoagulation and watch and wait #3 pursuing GI workup to try to diagnose the source of GI  bleeding and if treatable have it addressed and patient continue with anticoagulation. It is not clear what caused the GI bleeding to start with and the family was not interested in endoscopy at that time and they maintain the same stance now. They have elected to try anticoagulation and if patient bleeds then anticoagulation will be discontinued and they will re-assess the options although they seem inclined to do less. Pulmonary was consulted by the ED and recommended they wouldn't pursue aggressive thrombolytic therapy given patient's debility and recent GI bleed. We will therefore admit patient to the stepdown unit, follow septic workup including sputum culture/urine culture/blood cultures, start broad-spectrum antibiotic to cover for nosocomial organisms including MRSA/Pseudomonas given the fact that patient was hospitalized less than 90 days ago, start anticoagulation with heparin with plans to monitor hemoglobin and evidence of bleeding. If significant drop in hemoglobin would stop anticoagulation and confer with family regarding way forward. Will also obtain bilateral lower extremity venous Doppler/2-D echocardiogram to assess degree of right ventricular strain. At this point patient is full code. Family will continue to discuss goals of care given the fact that they have preferences that limit optimal intervention regarding treatment pathways.  Assessment/Plan  PE (pulmonary thromboembolism)  continue Eliquis.    Monitor hemoglobin, if significant drop will have to discuss hospice HCAP (healthcare-associated pneumonia), most likely aspiration pneumonia, /Tobacco dependency  Follow Septic workup D/c Vancomycin/Zosyn/ciprofloxacin and start unasyn Dementia/Protein-calorie malnutrition, severe  No acute changes  Continue home medications Acute blood loss anemia/Anemia, iron deficiency  Monitor hemoglobin, transfuse if PRBC less than 8 g/dL  Discontinue anticoagulation if overt GI  bleeding Acute hypokalemia/hypomagnesemia  Likely related to poor intake  Replenish potassium/magnesium as needed Pressure ulcers both  heels present at admission, stage II. Clean.  Supportive care DVT/GI Prophylaxis  On Eliquis  Protonix  Code Status: full code Family Communication: spoke with her daughter and caretaker Disposition Plan: in 1-2 days to home with assistance  Consultants:  None  Procedures:  None  Antibiotics:  Vancomycin 05/30/2015> 7/27  Zosyn 05/30/2015> 7/27  Ciprofloxacin 05/31/2015> 7/27  unasyn 7/27   HPI/Subjective:  Pleasantly confused, but denies pain, nausea, cough, SOB, dysuria  Objective: Filed Vitals:   06/01/15 2112 06/02/15 0537 06/02/15 1018 06/02/15 1326  BP: 121/59 121/65 151/98 138/69  Pulse: 73 76  68  Temp: 97.9 F (36.6 C) 98 F (36.7 C)  97.4 F (36.3 C)  TempSrc: Oral Oral  Oral  Resp: Height:      Weight:      SpO2: 100% 100%  99%    Intake/Output Summary (Last 24 hours) at 06/02/15 1912 Last data filed at 06/02/15 1500  Gross per 24 hour  Intake    360 ml  Output    725 ml  Net   -365 ml   Filed Weights   05/30/15 1700 05/31/15 0400  Weight: 48.7 kg (107 lb 5.8 oz) 49.7 kg (109 lb 9.1 oz)   Body mass index is 18.23 kg/(m^2).  Exam:   General:  Adult female, asleep but arouseable  HEENT:  NCAT, MMM  Cardiovascular:  RRR, nl S1, S2 no mrg, 2+ pulses, warm extremities   Respiratory:  rhonchorous bilateral BS, no increased WOB   Abdomen:   NABS, soft, NT/ND  MSK:   Normal tone and bulk, no LEE  Neuro:  Grossly intact  Data Reviewed: Basic Metabolic Panel:  Recent Labs Lab 05/30/15 1140 05/31/15 0408 06/01/15 0220 06/02/15 0520  NA 146* 145 141 140  K 3.4* 2.8* 4.1 3.4*  CL 108 112* 110 106  CO2 GLUCOSE 106* 114* 114* 65  BUN CREATININE 0.66 0.47 0.36* 0.44  CALCIUM 8.7* 8.1* 8.1* 8.5*  MG  --  1.9 2.2  --   PHOS  --  2.5 2.6  --    Liver  Function Tests:  Recent Labs Lab 05/30/15 1140 05/31/15 0408 06/01/15 0220  AST ALT 9* 9* 10*  ALKPHOS 99 91 87  BILITOT 0.6 0.6 0.5  PROT 6.4* 5.3* 5.4*  ALBUMIN 2.2* 1.9* 1.8*   No results for input(s): LIPASE, AMYLASE in the last 168 hours. No results for input(s): AMMONIA in the last 168 hours. CBC:  Recent Labs Lab 05/30/15 1140 05/30/15 2005 06/01/15 0220 06/02/15 0520  WBC 19.1* 20.3* 12.9* 14.7*  NEUTROABS  --   --  11.5*  --   HGB 11.5* 10.0* 9.2* 10.5*  HCT 37.7 33.5* 31.3* 34.7*  MCV 80.4 80.3 80.5 79.8  PLT 205 191 173 178    Recent Results (from the past 240 hour(s))  MRSA PCR Screening     Status: None   Collection Time: 05/30/15  6:15 PM  Result Value Ref Range Status   MRSA by PCR NEGATIVE NEGATIVE Final    Comment:        The GeneXpert MRSA Assay (FDA approved for NASAL specimens only), is one component of a comprehensive MRSA colonization surveillance program. It is not intended to diagnose MRSA infection nor to guide or monitor treatment for MRSA infections.   Urine culture     Status: None   Collection Time: 05/30/15  7:45 PM  Result Value Ref Range Status   Specimen Description URINE, CATHETERIZED  Final   Special Requests NONE  Final   Culture   Final    NO GROWTH 1 DAY Performed at [facility]    Report Status 06/01/2015 FINAL  Final     Studies: No results found.  Scheduled Meds: . ampicillin-sulbactam (UNASYN) IV  1.5 g Intravenous 4 times per day  . apixaban  10 mg Oral BID  . [START ON 06/06/2015] apixaban  5 mg Oral BID  . donepezil  5 mg Oral q morning - 10a  . feeding supplement  1 Container Oral TID BM  . guaiFENesin  1,200 mg Oral BID  . irbesartan  300 mg Oral Daily   And  . hydrochlorothiazide  12.5 mg Oral Daily  . nicotine  21 mg Transdermal Daily  . pantoprazole  40 mg Oral Daily  . predniSONE  40 mg Oral Q breakfast  . sodium chloride  3 mL Intravenous Q12H   Continuous Infusions: .  sodium chloride 20 mL/hr at 06/01/15 0300    Principal Problem:   PE (pulmonary thromboembolism) Active Problems:   Dementia   Protein-calorie malnutrition, severe   Acute blood loss anemia   Anemia, iron deficiency   HCAP (healthcare-associated pneumonia)   Tobacco dependency   Acute hypokalemia   Hypomagnesemia   Tricuspid regurgitation    Time spent: 30 min    Ebony Yorio  [facility] Pager [phone]. If 7PM-7AM, please contact night-coverage at [email or web address], password [facility] 06/02/2015, 7:12 PM  LOS: 3 days

## 2015-06-02 NOTE — Progress Notes (Signed)
ANTIBIOTIC CONSULT NOTE   Pharmacy Consult for vancomycin/zosyn/cipro to ampicllin/sulbactam Indication: HCAP vs aspiration PNA  No Known Allergies  Patient Measurements: Height:  (165.1 cm) Weight: 109 lb 9.1 oz (49.7 kg) IBW/kg (Calculated) : 57  Vital Signs: Temp: 98 F (36.7 C) (07/27 0537) Temp Source: Oral (07/27 0537) BP: 151/98 mmHg (07/27 1018) Pulse Rate: 76 (07/27 0537) Intake/Output from previous day: 07/26 0701 - 07/27 0700 In: 637 [P.O.:390; I.V.:47] Out: 100 [Urine:100] Intake/Output from this shift: Total I/O In: 60 [P.O.:60] Out: 625 [Urine:625]  Labs:  Recent Labs  05/30/15 2005 05/31/15 0408 06/01/15 0220 06/02/15 0520  WBC 20.3*  --  12.9* 14.7*  HGB 10.0*  --  9.2* 10.5*  PLT 191  --  173 178  CREATININE  --  0.47 0.36* 0.44   Estimated Creatinine Clearance: 38.9 mL/min (by C-G formula based on Cr of 0.44). No results for input(s): VANCOTROUGH, VANCOPEAK, VANCORANDOM, GENTTROUGH, GENTPEAK, GENTRANDOM, TOBRATROUGH, TOBRAPEAK, TOBRARND, AMIKACINPEAK, AMIKACINTROU, AMIKACIN in the last 72 hours.   Microbiology: Recent Results (from the past 720 hour(s))  Urine culture     Status: None   Collection Time: 05/16/15 11:15 AM  Result Value Ref Range Status   Specimen Description URINE, CATHETERIZED  Final   Special Requests Normal  Final   Culture   Final    6,000 COLONIES/mL INSIGNIFICANT GROWTH Performed at [facility]    Report Status 05/17/2015 FINAL  Final  MRSA PCR Screening     Status: None   Collection Time: 05/30/15  6:15 PM  Result Value Ref Range Status   MRSA by PCR NEGATIVE NEGATIVE Final    Comment:        The GeneXpert MRSA Assay (FDA approved for NASAL specimens only), is one component of a comprehensive MRSA colonization surveillance program. It is not intended to diagnose MRSA infection nor to guide or monitor treatment for MRSA infections.   Urine culture     Status: None   Collection Time: 05/30/15   7:45 PM  Result Value Ref Range Status   Specimen Description URINE, CATHETERIZED  Final   Special Requests NONE  Final   Culture   Final    NO GROWTH 1 DAY Performed at [facility]    Report Status 06/01/2015 FINAL  Final    Medical History: Past Medical History  Diagnosis Date  . Hypertension   . Dementia   . GERD (gastroesophageal reflux disease)   . DVT (deep venous thrombosis)     Medications:  Scheduled:  . apixaban  10 mg Oral BID  . [START ON 06/06/2015] apixaban  5 mg Oral BID  . donepezil  5 mg Oral q morning - 10a  . feeding supplement  1 Container Oral TID BM  . guaiFENesin  1,200 mg Oral BID  . irbesartan  300 mg Oral Daily   And  . hydrochlorothiazide  12.5 mg Oral Daily  . nicotine  21 mg Transdermal Daily  . pantoprazole  40 mg Oral Daily  . predniSONE  40 mg Oral Q breakfast  . sodium chloride  3 mL Intravenous Q12H   Infusions:  . sodium chloride 20 mL/hr at 06/01/15 0300   Assessment: 89 YOF started on broad spectrum antibiotics for PNA, narrowed to ampicillin/sulbactam for aspiration pneumonia on 7/27,   7/24 >> Zosyn >> 7/27 7/24 >> vancomycin >>  7/27 7/25 >> Cipro (MD) >> 7/27 7/27 >> amp/sulb >>  7/24 urine: NG MRSA nasal swab: neg  Renal:  Scr stable WBC elevated but stable on steroids afebrile  Goal of Therapy:  Dose for indication and for patient-specific parameters  Plan:  Day #4 antibiotics, D#1 amp/sulbactam 1) Ampicillin/sulbactam 1.5gm IV q6h   - do not anticipate dose adjustment, pharmacy to intervene if required  Juliette Alcide, PharmD, BCPS.   Pager: [phone] 06/02/2015 12:35 PM

## 2015-06-03 DIAGNOSIS — D509 Iron deficiency anemia, unspecified: Secondary | ICD-10-CM

## 2015-06-03 DIAGNOSIS — F0390 Unspecified dementia without behavioral disturbance: Secondary | ICD-10-CM

## 2015-06-03 LAB — CBC
HEMATOCRIT: 32.8 % — AB (ref 36.0–46.0)
HEMOGLOBIN: 10.1 g/dL — AB (ref 12.0–15.0)
MCH: 24 pg — ABNORMAL LOW (ref 26.0–34.0)
MCHC: 30.8 g/dL (ref 30.0–36.0)
MCV: 78.1 fL (ref 78.0–100.0)
PLATELETS: 196 10*3/uL (ref 150–400)
RBC: 4.2 MIL/uL (ref 3.87–5.11)
RDW: 26.7 % — ABNORMAL HIGH (ref 11.5–15.5)
WBC: 12.3 10*3/uL — ABNORMAL HIGH (ref 4.0–10.5)

## 2015-06-03 LAB — BASIC METABOLIC PANEL
Anion gap: 9 (ref 5–15)
BUN: 12 mg/dL (ref 6–20)
CALCIUM: 8.4 mg/dL — AB (ref 8.9–10.3)
CO2: 25 mmol/L (ref 22–32)
Chloride: 104 mmol/L (ref 101–111)
Creatinine, Ser: 0.36 mg/dL — ABNORMAL LOW (ref 0.44–1.00)
GFR calc non Af Amer: >60 mL/min (ref >60)
GLUCOSE: 97 mg/dL (ref 65–99)
Potassium: 3.6 mmol/L (ref 3.5–5.1)
Sodium: 138 mmol/L (ref 135–145)

## 2015-06-03 MED ORDER — GUAIFENESIN 100 MG/5ML PO SOLN
200.0000 mg | Freq: Four times a day (QID) | ORAL | Status: DC
  Administered 2015-06-03 – 2015-06-04 (×3): 200 mg via ORAL
  Filled 2015-06-03 (×3): qty 10
  Filled 2015-06-03: qty 20
  Filled 2015-06-03 (×2): qty 10

## 2015-06-03 MED ORDER — POTASSIUM CHLORIDE CRYS ER 20 MEQ PO TBCR: 40.0000 meq | EXTENDED_RELEASE_TABLET | Freq: Once | ORAL | Status: DC

## 2015-06-03 MED ORDER — OXYCODONE-ACETAMINOPHEN 5-325 MG PO TABS
1.0000 | ORAL_TABLET | ORAL | Status: DC | PRN
  Administered 2015-06-04: 1 via ORAL
  Filled 2015-06-03: qty 1

## 2015-06-03 NOTE — Progress Notes (Signed)
[facility] PROGRESS NOTE  Raylen Ken NWG:[id number] DOB: 11/06/1926 DOA: 05/30/2015 PCP: Pola Corn, MD  Brief Summary  Ann Kelley is an 81 year old female with multiple medical problems including Alzheimer's dementia/severe protein calorie malnutrition/recent acute blood loss anemia from a GI source/history of DVT/PE on Eliquis until recently, who comes in because of increasing shortness of breath associated with cough productive of yellow colored sputum for the last 3 days and she is found to have acute pulmonary embolism/healthcare associated pneumonia with CT chest showing " 1. Large pulmonary embolus in the right main pulmonary artery extending into the right upper, middle and lower lobar branches. Positive for acute PE with CT evidence of right heart strain (RV/LV Ratio = 1.17) consistent with at least submassive (intermediate risk) PE.  There is left lower lobe collapse with occlusion of the left lower lobe bronchus without a definitive obstructing mass.  Her white count is 19,100, hemoglobin 11.5 g/dl. the question for this lady's care is whether she'll be able to tolerate anticoagulation given that she had GI bleed recently, requiring PRBC transfusion which is why she was taken off anticoagulation.     We will therefore admit patient to the stepdown unit, follow septic workup including sputum culture/urine culture/blood cultures, start broad-spectrum antibiotic to cover for nosocomial organisms including MRSA/Pseudomonas given the fact that patient was hospitalized less than 90 days ago, start anticoagulation with heparin with plans to monitor hemoglobin and evidence of bleeding. If significant drop in hemoglobin would stop anticoagulation and confer with family regarding way forward. Will also obtain bilateral lower extremity venous Doppler/2-D echocardiogram to assess degree of right ventricular strain. At this point patient is full code. Family will continue to discuss goals  of care given the fact that they have preferences that limit optimal intervention regarding treatment pathways.  Assessment/Plan  PE (pulmonary thromboembolism).  Treatment options were reviewed with family including #1 IVC filter, #2 anticoagulation and watch and wait #3 pursuing GI workup to try to diagnose the source of GI bleeding and if treatable have it addressed and patient continue with anticoagulation.  Family was not interested in endoscopy.  They elected to try anticoagulation and if patient bleeds then anticoagulation would be discontinued and they will re-assess the options although they seem inclined to do less. Pulmonary was consulted by the ED and recommended against thrombolytic therapy given patient's debility and recent GI bleed. -  Continue Eliquis -  Hemoglobin approximately stable  Probable aspiration pneumonia -  Initially treated with Vancomycin/Zosyn/ciprofloxacin -  Due to suspicion of aspiration, she was transitioned to [employer] -  Speech therapy for swallow evaluation -  Consider follow-up CT chest in 3 months  Tobacco dependency -  Nicotine patch  Dementia/Protein-calorie malnutrition, severe - Continue aricept  Acute blood loss anemia/Anemia, iron deficiency -  hgb stable -  Transfuse if PRBC less than 8 g/dL -  Discontinue anticoagulation if overt GI bleeding  Acute hypokalemia/hypomagnesemia Likely related to poor intake Replenish potassium/magnesium as needed  Pressure ulcers both heels present at admission, stage II. Clean. Wound care consult  DVT Prophylaxis On Eliquis Protonix  Code Status: full code Family Communication: spoke with her daughter and caretaker Disposition Plan:  To home tomorrow  Consultants:  None  Procedures:  None  Antibiotics:  Vancomycin 05/30/2015> 7/27  Zosyn 05/30/2015> 7/27  Ciprofloxacin 05/31/2015> 7/27  unasyn 7/27   HPI/Subjective:  Confused, did not want to be disturbed today, but denied pain,  nausea, cough, SOB, dysuria  Objective: Filed Vitals:  06/02/15 1326 06/02/15 2045 06/03/15 0439 06/03/15 1305  BP: 138/69 140/74 155/88 138/74  Pulse: 68 71 83 84  Temp: 97.4 F (36.3 C) 97.8 F (36.6 C) 97.6 F (36.4 C) 98.4 F (36.9 C)  TempSrc: Oral Oral Oral Oral  Resp: 16 18 18 16   Height:      Weight:      SpO2: 99% 100% 100% 100%    Intake/Output Summary (Last 24 hours) at 06/03/15 1424 Last data filed at 06/03/15 1306  Gross per 24 hour  Intake     90 ml  Output    600 ml  Net   -510 ml   Filed Weights   05/30/15 1700 05/31/15 0400  Weight: 48.7 kg (107 lb 5.8 oz) 49.7 kg (109 lb 9.1 oz)   Body mass index is 18.23 kg/(m^2).  Exam:   General:  Adult female, awake and wanted to be left alone.  Weak cough  HEENT:  NCAT, MMM  Cardiovascular:  RRR, nl S1, S2 no mrg, 2+ pulses, warm extremities   Respiratory:  rhonchorous bilateral BS, no increased WOB   Abdomen:   NABS, soft, NT/ND  MSK:   Normal tone and bulk, no LEE  Neuro:  Grossly intact  Data Reviewed: Basic Metabolic Panel:  Recent Labs Lab 05/30/15 1140 05/31/15 0408 06/01/15 0220 06/02/15 0520 06/03/15 0450  NA 146* 145 141 140 138  K 3.4* 2.8* 4.1 3.4* 3.6  CL 108 112* 110 106 104  CO2 29 27 24 22 25   GLUCOSE 106* 114* 114* 65 97  BUN 19 19 18 15 12   CREATININE 0.66 0.47 0.36* 0.44 0.36*  CALCIUM 8.7* 8.1* 8.1* 8.5* 8.4*  MG  --  1.9 2.2  --   --   PHOS  --  2.5 2.6  --   --    Liver Function Tests:  Recent Labs Lab 05/30/15 1140 05/31/15 0408 06/01/15 0220  AST 18 18 17   ALT 9* 9* 10*  ALKPHOS 99 91 87  BILITOT 0.6 0.6 0.5  PROT 6.4* 5.3* 5.4*  ALBUMIN 2.2* 1.9* 1.8*   No results for input(s): LIPASE, AMYLASE in the last 168 hours. No results for input(s): AMMONIA in the last 168 hours. CBC:  Recent Labs Lab 05/30/15 1140 05/30/15 2005 06/01/15 0220 06/02/15 0520 06/03/15 0450  WBC 19.1* 20.3* 12.9* 14.7* 12.3*  NEUTROABS  --   --  11.5*  --   --   HGB  11.5* 10.0* 9.2* 10.5* 10.1*  HCT 37.7 33.5* 31.3* 34.7* 32.8*  MCV 80.4 80.3 80.5 79.8 78.1  PLT 205 191 173 178 196    Recent Results (from the past 240 hour(s))  MRSA PCR Screening     Status: None   Collection Time: 05/30/15  6:15 PM  Result Value Ref Range Status   MRSA by PCR NEGATIVE NEGATIVE Final    Comment:        The GeneXpert MRSA Assay (FDA approved for NASAL specimens only), is one component of a comprehensive MRSA colonization surveillance program. It is not intended to diagnose MRSA infection nor to guide or monitor treatment for MRSA infections.   Urine culture     Status: None   Collection Time: 05/30/15  7:45 PM  Result Value Ref Range Status   Specimen Description URINE, CATHETERIZED  Final   Special Requests NONE  Final   Culture   Final    NO GROWTH 1 DAY Performed at [facility]    Report Status  06/01/2015 FINAL  Final     Studies: No results found.  Scheduled Meds: . ampicillin-sulbactam (UNASYN) IV  1.5 g Intravenous 4 times per day  . apixaban  10 mg Oral BID  . [START ON 06/06/2015] apixaban  5 mg Oral BID  . donepezil  5 mg Oral q morning - 10a  . feeding supplement  1 Container Oral TID BM  . guaiFENesin  200 mg Oral 4 times per day  . irbesartan  300 mg Oral Daily   And  . hydrochlorothiazide  12.5 mg Oral Daily  . nicotine  21 mg Transdermal Daily  . pantoprazole  40 mg Oral Daily  . predniSONE  40 mg Oral Q breakfast  . saccharomyces boulardii  250 mg Oral BID  . sodium chloride  3 mL Intravenous Q12H   Continuous Infusions: . sodium chloride 20 mL/hr at 06/01/15 0300    Principal Problem:   PE (pulmonary thromboembolism) Active Problems:   Dementia   Protein-calorie malnutrition, severe   Acute blood loss anemia   Anemia, iron deficiency   HCAP (healthcare-associated pneumonia)   Tobacco dependency   Acute hypokalemia   Hypomagnesemia   Tricuspid regurgitation    Time spent: 30 min    Travante Knee,  Stanely Sexson  [facility] Pager [phone]. If 7PM-7AM, please contact night-coverage at [email or web address], password [facility] 06/03/2015, 2:24 PM  LOS: 4 days

## 2015-06-03 NOTE — Consult Note (Addendum)
WOC wound consult note Reason for Consult: Consult requested for multiple wounds.  Pt is familiar to [facility] team from previous admission on 7/10 and wounds have declined. Daughter at bedside to assess all wounds during the consult. Wound type: Left outer ankle with deep tissue injury 10X7cm dark intact skin, with stage 4 pressure injury which has opened in the center 2.5X2X.2cm.  Bone palpable with swab, dark red woundbed surrounded by loose peeling skin.  No odor, small amt yellow drainage. Left middle thigh with full thickness wound; 2.5X2.5X.8cm, mod amt yellow drainage, no odor, red moist wound bed. Sacrum and upper buttocks with patchy areas of partial thickness skin loss; appearance is consistent with moisture associated skin damage.  This is interspersed with patchy areas of darker-colored skin and deeper purple deep tissue injuries which have not evolved at this time.  Pt is frequently incontinent of stool and it is difficult to keep the dressing from becoming soiled. Right anterior foot with deep tissue injury; 1X5cm with intact darker colored skin Right heel with with deep tissue injury; 5X6cm with intact darker colored skin Dressing procedure/placement/frequency: Pt has multiple systemic factors that can impair healing; poor perfusion, incontinence, decreased nutritional status, immobility.  Alginate to absorb drainage to left thigh wound. Vaseline gauze to left ankle wound to decrease adherence with dressing changes.  Barrier cream to protect buttocks and repel moisture.  Foam dressing to upper sacrum and right heel to protect from further injury. Prevalon boots to decrease pressure to heels. Discussed plan of care with daughter at bedside, she verbalizes understanding that wounds may continue to decline despite optimal care. Please re-consult if further assistance is needed.  Thank-you,  Cammie Mcgee MSN, RN, CWOCN, [place], CNS [phone]

## 2015-06-03 NOTE — Progress Notes (Signed)
Nutrition Follow-up  DOCUMENTATION CODES:   Severe malnutrition in context of chronic illness, Underweight  INTERVENTION:  - Continue Boost Breeze TID, Magic Cup BID, and Kozy Shack pudding BID - RD will continue to monitor for needs  NUTRITION DIAGNOSIS:   Increased nutrient needs related to wound healing as evidenced by estimated needs. -ongoing  GOAL:   Patient will meet greater than or equal to 90% of their needs -unmet  MONITOR:   PO intake, Supplement acceptance, Labs, Weight trends, Skin, I & O's  ASSESSMENT:   81 year old female with multiple medical problems including Alzheimer's dementia/severe protein calorie malnutrition/recent acute blood loss anemia from a GI source/history of DVT/PE on Eliquis until recently, who comes in because of increasing shortness of breath associated with cough productive of yellow colored sputum for the last 3 days.  Pt sleeping at time of visit and noted to have dementia; no family/visitors present. Noted that there was fruit cocktail on bedside table which appeared mainly untouched, untouched Ensure Enlive, and an unopened bag of Cheez-It.  RD talked with family about protein supplements at last assessment (7/25) and provided them with list of protein sources/supplements at that time.   Pt has been eating 5-10% of meals per chart review which is not meeting needs. Will monitor for GOC to determine appropriate plans concerning nutrition. Medications reviewed. Labs reviewed; creatinine low, Ca: 8.4 mg/dL.  Diet Order:  DIET DYS 3 Room service appropriate?: Yes; Fluid consistency:: Thin  Skin:  Stage 3 L leg, stage 2 L ankle, and stage 1 L hip pressure ulcers; DTI to R hip; Wounds to shoulder and L buttocks  Last BM:  7/24  Height:   Ht Readings from Last 1 Encounters:  05/31/15  (1.651 m)    Weight:   Wt Readings from Last 1 Encounters:  05/31/15 109 lb 9.1 oz (49.7 kg)    Ideal Body Weight:  56.8 kg  BMI:  Body mass  index is 18.23 kg/(m^2).  Estimated Nutritional Needs:   Kcal:  1500-1700  Protein:  75-85g  Fluid:  2L/day  EDUCATION NEEDS:   No education needs identified at this time     Trenton Gammon, RD, LDN Inpatient Clinical Dietitian Pager # [phone] After hours/weekend pager # [phone]

## 2015-06-03 NOTE — Progress Notes (Signed)
OT Cancellation Note  Patient Details Name: Coral Timme MRN: [id number] DOB: [birth year]   Cancelled Treatment:    Reason Eval/Treat Not Completed: OT screened, no needs identified, will sign off.  Noted pt is bedbound at baseline and cared for by family. Katelyne Galster 06/03/2015, 10:03 AM  Marica Otter, OTR/L [phone] 06/03/2015

## 2015-06-04 LAB — BASIC METABOLIC PANEL
ANION GAP: 7 (ref 5–15)
BUN: 12 mg/dL (ref 6–20)
CO2: 29 mmol/L (ref 22–32)
Calcium: 8.2 mg/dL — ABNORMAL LOW (ref 8.9–10.3)
Chloride: 103 mmol/L (ref 101–111)
Creatinine, Ser: 0.32 mg/dL — ABNORMAL LOW (ref 0.44–1.00)
GFR calc Af Amer: >60 mL/min (ref >60)
GFR calc non Af Amer: >60 mL/min (ref >60)
GLUCOSE: 89 mg/dL (ref 65–99)
Potassium: 3.2 mmol/L — ABNORMAL LOW (ref 3.5–5.1)
SODIUM: 139 mmol/L (ref 135–145)

## 2015-06-04 LAB — CBC
HCT: 31 % — ABNORMAL LOW (ref 36.0–46.0)
HEMOGLOBIN: 9.4 g/dL — AB (ref 12.0–15.0)
MCH: 23.7 pg — AB (ref 26.0–34.0)
MCHC: 30.3 g/dL (ref 30.0–36.0)
MCV: 78.3 fL (ref 78.0–100.0)
Platelets: 245 10*3/uL (ref 150–400)
RBC: 3.96 MIL/uL (ref 3.87–5.11)
RDW: 27.2 % — AB (ref 11.5–15.5)
WBC: 9.4 10*3/uL (ref 4.0–10.5)

## 2015-06-04 MED ORDER — AMOXICILLIN-POT CLAVULANATE 500-125 MG PO TABS: 1.0000 | ORAL_TABLET | Freq: Three times a day (TID) | ORAL | Status: AC

## 2015-06-04 MED ORDER — OXYCODONE-ACETAMINOPHEN 5-325 MG PO TABS: 1.0000 | ORAL_TABLET | ORAL | Status: AC | PRN

## 2015-06-04 MED ORDER — SACCHAROMYCES BOULARDII 250 MG PO CAPS: 250.0000 mg | ORAL_CAPSULE | Freq: Two times a day (BID) | ORAL | Status: AC

## 2015-06-04 MED ORDER — NICOTINE 7 MG/24HR TD PT24: 7.0000 mg | MEDICATED_PATCH | Freq: Every day | TRANSDERMAL | Status: AC

## 2015-06-04 MED ORDER — NICOTINE 14 MG/24HR TD PT24: 14.0000 mg | MEDICATED_PATCH | Freq: Every day | TRANSDERMAL | Status: AC

## 2015-06-04 MED ORDER — POTASSIUM CHLORIDE CRYS ER 20 MEQ PO TBCR
20.0000 meq | EXTENDED_RELEASE_TABLET | Freq: Once | ORAL | Status: AC
  Administered 2015-06-04: 20 meq via ORAL
  Filled 2015-06-04: qty 1

## 2015-06-04 NOTE — Progress Notes (Signed)
Pt for discharge to home and RN notified CSW that pt needs ambulance transport to home.   CSW met with pt family at bedside to confirm address.  CSW arranged ambulance transport via [place] for pt to home.  No further social work needs identified at this time.  CSW signing off.   Alison Murray, MSW, [place] Work [phone]

## 2015-06-04 NOTE — Evaluation (Signed)
{  EVAL NOTES:30SLP Cancellation Note  Patient Details Name: Ann Kelley MRN: [id number] DOB: 02-18-1926   Cancelled treatment:       Reason Eval/Treat Not Completed: Other (comment) (pt to dc, slp spoke to md and slp eval not necessary at this time)   Chales Abrahams 06/04/2015, 12:03 PM  Donavan Burnet, MS [facility] SLP [phone]

## 2015-06-04 NOTE — Progress Notes (Signed)
Daughter Dois Davenport selected [facility] for HHRN/NA needs.  Referral given to in house rep.

## 2015-06-04 NOTE — Care Management Important Message (Signed)
Important Message  Patient Details  Name: Sharena Dibenedetto MRN: [id number] Date of Birth: 1926/08/04   Medicare Important Message Given:  Yes-third notification given    Haskell Flirt 06/04/2015, 1:07 PMImportant Message  Patient Details  Name: Lissete Maestas MRN: [id number] Date of Birth: [birth year]   Medicare Important Message Given:  Yes-third notification given    Haskell Flirt 06/04/2015, 1:07 PM

## 2015-06-04 NOTE — Progress Notes (Signed)
ANTICOAGULATION CONSULT NOTE - Follow Up Consult  Pharmacy Consult for Apixaban Indication: PE, DVT  No Known Allergies  Patient Measurements: Height:  (165.1 cm) Weight: 109 lb 9.1 oz (49.7 kg) IBW/kg (Calculated) : 57  Vital Signs: Temp: 98.5 F (36.9 C) (07/29 0553) Temp Source: Oral (07/29 0553) BP: 133/66 mmHg (07/29 0553) Pulse Rate: 81 (07/29 0553)  Labs:  Recent Labs  06/02/15 0520 06/03/15 0450 06/04/15 0432  HGB 10.5* 10.1* 9.4*  HCT 34.7* 32.8* 31.0*  PLT 178 196 245  CREATININE 0.44 0.36* 0.32*    Estimated Creatinine Clearance: 38.9 mL/min (by C-G formula based on Cr of 0.32).   Medications:  Infusions:  . sodium chloride 20 mL/hr at 06/01/15 0300    Assessment: 81 yo presents to ER with weakness and shortness of breath. PMH includes dementia, HTN, GERD, hx DVT. Patient recently admitted for GIB and discharge plan on 7/12 was to hold apixaban until seen by PCP who can recheck Hgb. It appears apixaban was restarted and last dose patient got was 7/21 per home med list. Given apixaban recently held, a CT of chest was done and patient now has new PE. FOB was negative and Lovenox /kg x 1 dose was given in ED at 2pm on 7/24, then orders received to start IV heparin per pharmacy.  Heparin continued through 7/26, then patient to transition back to apixaban per pharmacy.   Baseline INR WNL, aPTT slightly elevated.  Baseline heparin level high as expected given recent Enoxaparin dose.  Prior anticoagulation: Eliquis 5 mg daily (per patient report); last dose 7/21  Significant events: 7/25: Venous duplex + for BLE DVT  Today, 06/04/2015:  CBC: Pltc WNL; Hgb trending down but fairly close to baseline values  CrCl: 39 ml/min  No reports of bleeding overnight  No interacting medications noted  Goal of Therapy: Monitor platelets by anticoagulation protocol: Yes  Plan:  On apixaban 10 mg PO bid x 5 days, then 5 mg PO bid. Per manufacturer, No dose  reductions for weight/age/SCr with VTE dosing, but note that patient is 81yrs old and 50 kg.  Given this as well as recent GIB, would opt to anticoagulate conservatively.  shorten initial phase from recommended 7 days to 5 days as patient was on IV heparin for 2 days.  May also consider reducing dose to 2.5 mg bid (prophylaxis)  sooner than the recommended 6 months.  Regardless of anticoagulation strategy, apixaban should be dosed bid, NOT daily as was on prior to admission.  CBC at least every 72 hr while on anticoagulation  Monitor for signs of bleeding.  MD aware of dropping hemoglobin and recent GI bleed.  Plan is to transfuse for Hgb < 8 and stop anticoagulation with any overt GI bleeding.  Juliette Alcide, PharmD, BCPS.   Pager: [phone] 06/04/2015, 9:51 AM

## 2015-06-04 NOTE — Discharge Summary (Addendum)
Physician Discharge Summary  Ann Kelley JXB:[id number] DOB: [birth year] DOA: 05/30/2015  PCP: Pola Corn, MD  Admit date: 05/30/2015 Discharge date: 06/04/2015  Recommendations for Outpatient Follow-up:  1. Continue apixaban with close monitoring for bleeding 2. Repeat CT chest in three months  3. Continue augmentin through 7/30, then stop 4. Given rx for florastor 5. F/u with PCP in 1 week for repeat CBC and to monitor for bleeding 6. Suction catheter,  HH aid and RN for home  Discharge Diagnoses:  Principal Problem:   PE (pulmonary thromboembolism) Active Problems:   Dementia   Protein-calorie malnutrition, severe   Acute blood loss anemia   Anemia, iron deficiency   HCAP (healthcare-associated pneumonia)   Tobacco dependency   Acute hypokalemia   Hypomagnesemia   Tricuspid regurgitation   Discharge Condition: stable, improved  Diet recommendation: dysphagia 3 with thin  Wt Readings from Last 3 Encounters:  05/31/15 49.7 kg (109 lb 9.1 oz)  05/16/15 48.671 kg (107 lb 4.8 oz)  06/07/14 57.9 kg (127 lb 10.3 oz)    History of present illness:  Ann Kelley is an 81 year old female with multiple medical problems including Alzheimer's dementia/severe protein calorie malnutrition/recent acute blood loss anemia from a GI source/history of DVT/PE on Eliquis until recently, who comes in because of increasing shortness of breath associated with cough productive of yellow colored sputum for the last 3 days and she is found to have acute pulmonary embolism/healthcare associated pneumonia with CT chest showing " 1. Large pulmonary embolus in the right main pulmonary artery extending into the right upper, middle and lower lobar branches. Positive for acute PE with CT evidence of right heart strain (RV/LV Ratio = 1.17) consistent with at least submassive (intermediate risk) PE. There is left lower lobe collapse with occlusion of the left lower lobe bronchus without a definitive  obstructing mass. Her white count is 19,100, hemoglobin 11.5 g/dl. the question for this lady's care is whether she'll be able to tolerate anticoagulation given that she had GI bleed recently, requiring PRBC transfusion which is why she was taken off anticoagulation.   We will therefore admit patient to the stepdown unit, follow septic workup including sputum culture/urine culture/blood cultures, start broad-spectrum antibiotic to cover for nosocomial organisms including MRSA/Pseudomonas given the fact that patient was hospitalized less than 90 days ago, start anticoagulation with heparin with plans to monitor hemoglobin and evidence of bleeding. If significant drop in hemoglobin would stop anticoagulation and confer with family regarding way forward. Will also obtain bilateral lower extremity venous Doppler/2-D echocardiogram to assess degree of right ventricular strain. At this point patient is full code. Family will continue to discuss goals of care given the fact that they have preferences that limit optimal intervention regarding treatment pathways.    Hospital Course:   PE (pulmonary thromboembolism). Treatment options were reviewed with family including #1 IVC filter, #2 anticoagulation and watch and wait #3 pursuing GI workup to try to diagnose the source of GI bleeding and if treatable have it addressed and patient continue with anticoagulation. Family was not interested in endoscopy. They elected to try anticoagulation and if patient bleeds then anticoagulation would be discontinued and they will re-assess the options although they seem inclined to do less. Pulmonary was consulted by the ED and recommended against thrombolytic therapy given patient's debility and recent GI bleed.  She was started on heparin gtt and transitioned to eliquis without obvious bleeding.  Her hemoglobin remained approximately stable and was 9.4mg /dl on the date of  discharge.  The family was aware to cease a/c and come  immediately to the hospital if she has bleeding.  They are also aware that she may die from bleeding.  She is DNR/DNI.  Probable aspiration pneumonia, Initially treated with Vancomycin/Zosyn/ciprofloxacin and due to suspicion of aspiration, she was transitioned to unasyn.  Recommended a softener foods or even pureed diet with thin liquids at the discretion of the family with aspiration precautions such as chin tucking and sitting upright for meals.  She will complete a 7-day course of antibiotics.  Consider follow-up CT chest in 3 months.  Tobacco dependency, decreasing nicotine patches at home.    Dementia/Protein-calorie malnutrition, severe, continued aricept.  Acute blood loss anemia/Anemia, iron deficiency, hemoglobin remained stable and she did not require blood transfusion.    Acute hypokalemia/hypomagnesemia, likely related to poor intake and resolved with oral and IV supplementation.  Pressure ulcers both heels present at admission, stage II. Wound care was consulted.  Alginate to absorb drainage to left thigh wound. Vaseline gauze to left ankle wound to decrease adherence with dressing changes. Barrier cream to protect buttocks and repel moisture. Foam dressing to upper sacrum and right heel to protect from further injury. Prevalon boots to decrease pressure to heels.  Consultants:  None  Procedures:  None  Antibiotics:  Vancomycin 05/30/2015> 7/27  Zosyn 05/30/2015> 7/27  Ciprofloxacin 05/31/2015> 7/27  unasyn 7/27  Discharge Exam: Filed Vitals:   06/04/15 0553  BP: 133/66  Pulse: 81  Temp: 98.5 F (36.9 C)  Resp: 18   Filed Vitals:   06/03/15 0439 06/03/15 1305 06/03/15 2033 06/04/15 0553  BP: 155/88 138/74 153/72 133/66  Pulse: 83 84 82 81  Temp: 97.6 F (36.4 C) 98.4 F (36.9 C) 97.7 F (36.5 C) 98.5 F (36.9 C)  TempSrc: Oral Oral Oral Oral  Resp: 18 16 18 18   Height:      Weight:      SpO2: 100% 100% 100% 94%     General: Adult female,  awake and wanted to be left alone. Weak cough.  Slumped in bed with difficulty holding head up  HEENT: NCAT, MMM  Cardiovascular: RRR, nl S1, S2 no mrg, 2+ pulses, warm extremities   Respiratory: rhonchorous bilateral BS, no increased WOB   Abdomen: NABS, soft, NT/ND  MSK: Normal tone and bulk, trace bilateral LEE, feet in prevalon boots with pressure dressings on both heals  Neuro: Grossly intact  Discharge Instructions      Discharge Instructions    Call MD for:  difficulty breathing, headache or visual disturbances    Complete by:  As directed      Call MD for:  extreme fatigue    Complete by:  As directed      Call MD for:  hives    Complete by:  As directed      Call MD for:  persistant dizziness or light-headedness    Complete by:  As directed      Call MD for:  persistant nausea and vomiting    Complete by:  As directed      Call MD for:  severe uncontrolled pain    Complete by:  As directed      Call MD for:  temperature >100.4    Complete by:  As directed      Diet general    Complete by:  As directed      Discharge instructions    Complete by:  As directed   Ms. Tennis Ship  was hospitalized with pulmonary embolism and pneumonia.  Tonight and Saturday, please give her TWO tabs of apixaban (Eliquis) in the morning and TWO tabs in the evening.  On Sunday, please go back to ONE tab in the morning and ONE tab at night and continue that dose going forward.  If she has any bleeding, stop the apixaban immediately and come to the hospital.  For her pneumonia, she needs augmentin twice a day, the next dose is this evening, and she will complete her course tomorrow.  For pain, please use oxycodone as needed.  Use the 14mg  nicotine patches for one week, then the 7mg  patches for one week, then stop.     Increase activity slowly    Complete by:  As directed             Medication List    STOP taking these medications        traMADol 50 MG tablet  Commonly known as:   ULTRAM      TAKE these medications        acetaminophen 500 MG tablet  Commonly known as:  TYLENOL  Take 1,000 mg by mouth every 6 (six) hours as needed for moderate pain.     amoxicillin-clavulanate 500-125 MG per tablet  Commonly known as:  AUGMENTIN  Take 1 tablet (500 mg total) by mouth 3 (three) times daily.     apixaban 5 MG Tabs tablet  Commonly known as:  ELIQUIS  Take 1 tablet (5 mg total) by mouth 2 (two) times daily.     donepezil 5 MG tablet  Commonly known as:  ARICEPT  Take 5 mg by mouth every morning.     feeding supplement (ENSURE ENLIVE) Liqd  Take 237 mLs by mouth 3 (three) times daily between meals.     nicotine 14 mg/24hr patch  Commonly known as:  NICODERM CQ - dosed in mg/24 hours  Place 1 patch (14 mg total) onto the skin daily.     nicotine 7 mg/24hr patch  Commonly known as:  NICODERM CQ - dosed in mg/24 hr  Place 1 patch (7 mg total) onto the skin daily.     oxyCODONE-acetaminophen 5-325 MG per tablet  Commonly known as:  ROXICET  Take 1 tablet by mouth every 4 (four) hours as needed for severe pain.     pantoprazole 40 MG tablet  Commonly known as:  PROTONIX  Take 1 tablet (40 mg total) by mouth daily at 12 noon.     saccharomyces boulardii 250 MG capsule  Commonly known as:  FLORASTOR  Take 1 capsule (250 mg total) by mouth 2 (two) times daily.     valsartan-hydrochlorothiazide 320-12.5 MG per tablet  Commonly known as:  DIOVAN-HCT  Take 1 tablet by mouth daily.       Follow-up Information    Follow up with Pola Corn, MD. Schedule an appointment as soon as possible for a visit in 1 week.   Specialty:  Cardiology   Contact information:   [address] [place] [address] [phone]        The results of significant diagnostics from this hospitalization (including imaging, microbiology, ancillary and laboratory) are listed below for reference.    Significant Diagnostic Studies: Dg Chest 2 View  05/30/2015    CLINICAL DATA:  Smoker with 1 day history of cough and chest congestion. Personal history of right middle lobe pulmonary embolus in August, 2015.  EXAM: CHEST  2 VIEW  COMPARISON:  06/07/2014 and earlier, including CTA chest 06/07/2014.  FINDINGS: AP semi-erect and lateral images were obtained. Lateral image less than optimal due to overlying clothing. Cardiac silhouette mildly to moderately enlarged, unchanged. Pleuroparenchymal scarring in the left upper lobe, unchanged. Emphysematous changes throughout both lungs, unchanged. Suboptimal inspiration accounts for atelectasis in the left lower lobe. Lungs otherwise clear. No visible pleural effusions. No pneumothorax. Osseous demineralization with old compression fracture of what I believe is the T7 vertebral body.  IMPRESSION: 1. Suboptimal inspiration accounts for mild left lower lobe atelectasis. No acute cardiopulmonary disease otherwise. 2. COPD/emphysema. Stable pleuroparenchymal scarring involving the left upper lobe. 3. Stable mild to moderate cardiomegaly without pulmonary edema.   Electronically Signed   By: Hulan Saas M.D.   On: 05/30/2015 11:43   Ct Angio Chest Pe W/cm &/or Wo Cm  05/30/2015   CLINICAL DATA:  Shortness of breath, hypoxia, at rectal bleeding recently and Eloquist was discontinued  EXAM: CT ANGIOGRAPHY CHEST WITH CONTRAST  TECHNIQUE: Multidetector CT imaging of the chest was performed using the standard protocol during bolus administration of intravenous contrast. Multiplanar CT image reconstructions and MIPs were obtained to evaluate the vascular anatomy.  CONTRAST:  OMNIPAQUE IOHEXOL 350 MG/ML SOLN  COMPARISON:  06/07/2014  FINDINGS: There is adequate opacification of the pulmonary arteries. Large pulmonary embolus in the right main pulmonary artery extending into the right upper, middle and lower lobar branches. There is dilatation of the right main pulmonary artery. The heart size is normal. There is no pericardial  effusion. Coronary artery atherosclerosis involving the left main, lad, circumflex and RCA.  There is mild right basilar atelectasis. There is left lower lobe collapse with occlusion of the left lower lobe bronchus without a definitive obstructing mass. There is bilateral mild centrilobular emphysema.  There is no axillary, hilar, or mediastinal adenopathy.  There is no lytic or blastic osseous lesion.  The visualized portions of the upper abdomen are unremarkable.  Review of the MIP images confirms the above findings.  IMPRESSION: 1. Large pulmonary embolus in the right main pulmonary artery extending into the right upper, middle and lower lobar branches. Positive for acute PE with CT evidence of right heart strain (RV/LV Ratio = 1.17) consistent with at least submassive (intermediate risk) PE. The presence of right heart strain has been associated with an increased risk of morbidity and mortality. Please activate Code PE by paging [phone]. Critical Value/emergent results were called by telephone at the time of interpretation on 05/30/2015 at 1:27 pm to Dr. Cathren Laine , who verbally acknowledged these results. 2. There is mild right basilar atelectasis. There is left lower lobe collapse with occlusion of the left lower lobe bronchus without a definitive obstructing mass. Recommend follow-up CT chest in 3 months.   Electronically Signed   By: Elige Ko   On: 05/30/2015 13:33    Microbiology: Recent Results (from the past 240 hour(s))  MRSA PCR Screening     Status: None   Collection Time: 05/30/15  6:15 PM  Result Value Ref Range Status   MRSA by PCR NEGATIVE NEGATIVE Final    Comment:        The GeneXpert MRSA Assay (FDA approved for NASAL specimens only), is one component of a comprehensive MRSA colonization surveillance program. It is not intended to diagnose MRSA infection nor to guide or monitor treatment for MRSA infections.   Urine culture     Status: None   Collection Time:  05/30/15  7:45 PM  Result  Value Ref Range Status   Specimen Description URINE, CATHETERIZED  Final   Special Requests NONE  Final   Culture   Final    NO GROWTH 1 DAY Performed at [facility]    Report Status 06/01/2015 FINAL  Final     Labs: Basic Metabolic Panel:  Recent Labs Lab 05/31/15 0408 06/01/15 0220 06/02/15 0520 06/03/15 0450 06/04/15 0432  NA 145 141 140 138 139  K 2.8* 4.1 3.4* 3.6 3.2*  CL 112* 110 106 104 103  CO2 GLUCOSE 114* 114* 65 97 89  BUN CREATININE 0.47 0.36* 0.44 0.36* 0.32*  CALCIUM 8.1* 8.1* 8.5* 8.4* 8.2*  MG 1.9 2.2  --   --   --   PHOS 2.5 2.6  --   --   --    Liver Function Tests:  Recent Labs Lab 05/30/15 1140 05/31/15 0408 06/01/15 0220  AST ALT 9* 9* 10*  ALKPHOS 99 91 87  BILITOT 0.6 0.6 0.5  PROT 6.4* 5.3* 5.4*  ALBUMIN 2.2* 1.9* 1.8*   No results for input(s): LIPASE, AMYLASE in the last 168 hours. No results for input(s): AMMONIA in the last 168 hours. CBC:  Recent Labs Lab 05/30/15 2005 06/01/15 0220 06/02/15 0520 06/03/15 0450 06/04/15 0432  WBC 20.3* 12.9* 14.7* 12.3* 9.4  NEUTROABS  --  11.5*  --   --   --   HGB 10.0* 9.2* 10.5* 10.1* 9.4*  HCT 33.5* 31.3* 34.7* 32.8* 31.0*  MCV 80.3 80.5 79.8 78.1 78.3  PLT 191 173 178 196 245   Cardiac Enzymes:  Recent Labs Lab 05/30/15 1140  TROPONINI 0.09*   BNP: BNP (last 3 results) No results for input(s): BNP in the last 8760 hours.  ProBNP (last 3 results) No results for input(s): PROBNP in the last 8760 hours.  CBG: No results for input(s): GLUCAP in the last 168 hours.  Time coordinating discharge: 35 minutes  Signed:  Autumn Gunn  [facility] 06/04/2015, 11:08 AM

## 2015-08-07 DEATH — deceased

## 2015-10-31 IMAGING — CR DG CHEST 2V
2 series · 2 of 2 positions shown · non-contrast
Comparison: 12/19/2010

CLINICAL DATA: Left lower extremity edema.

EXAM:
CHEST - 2 VIEW

[w chest lat]
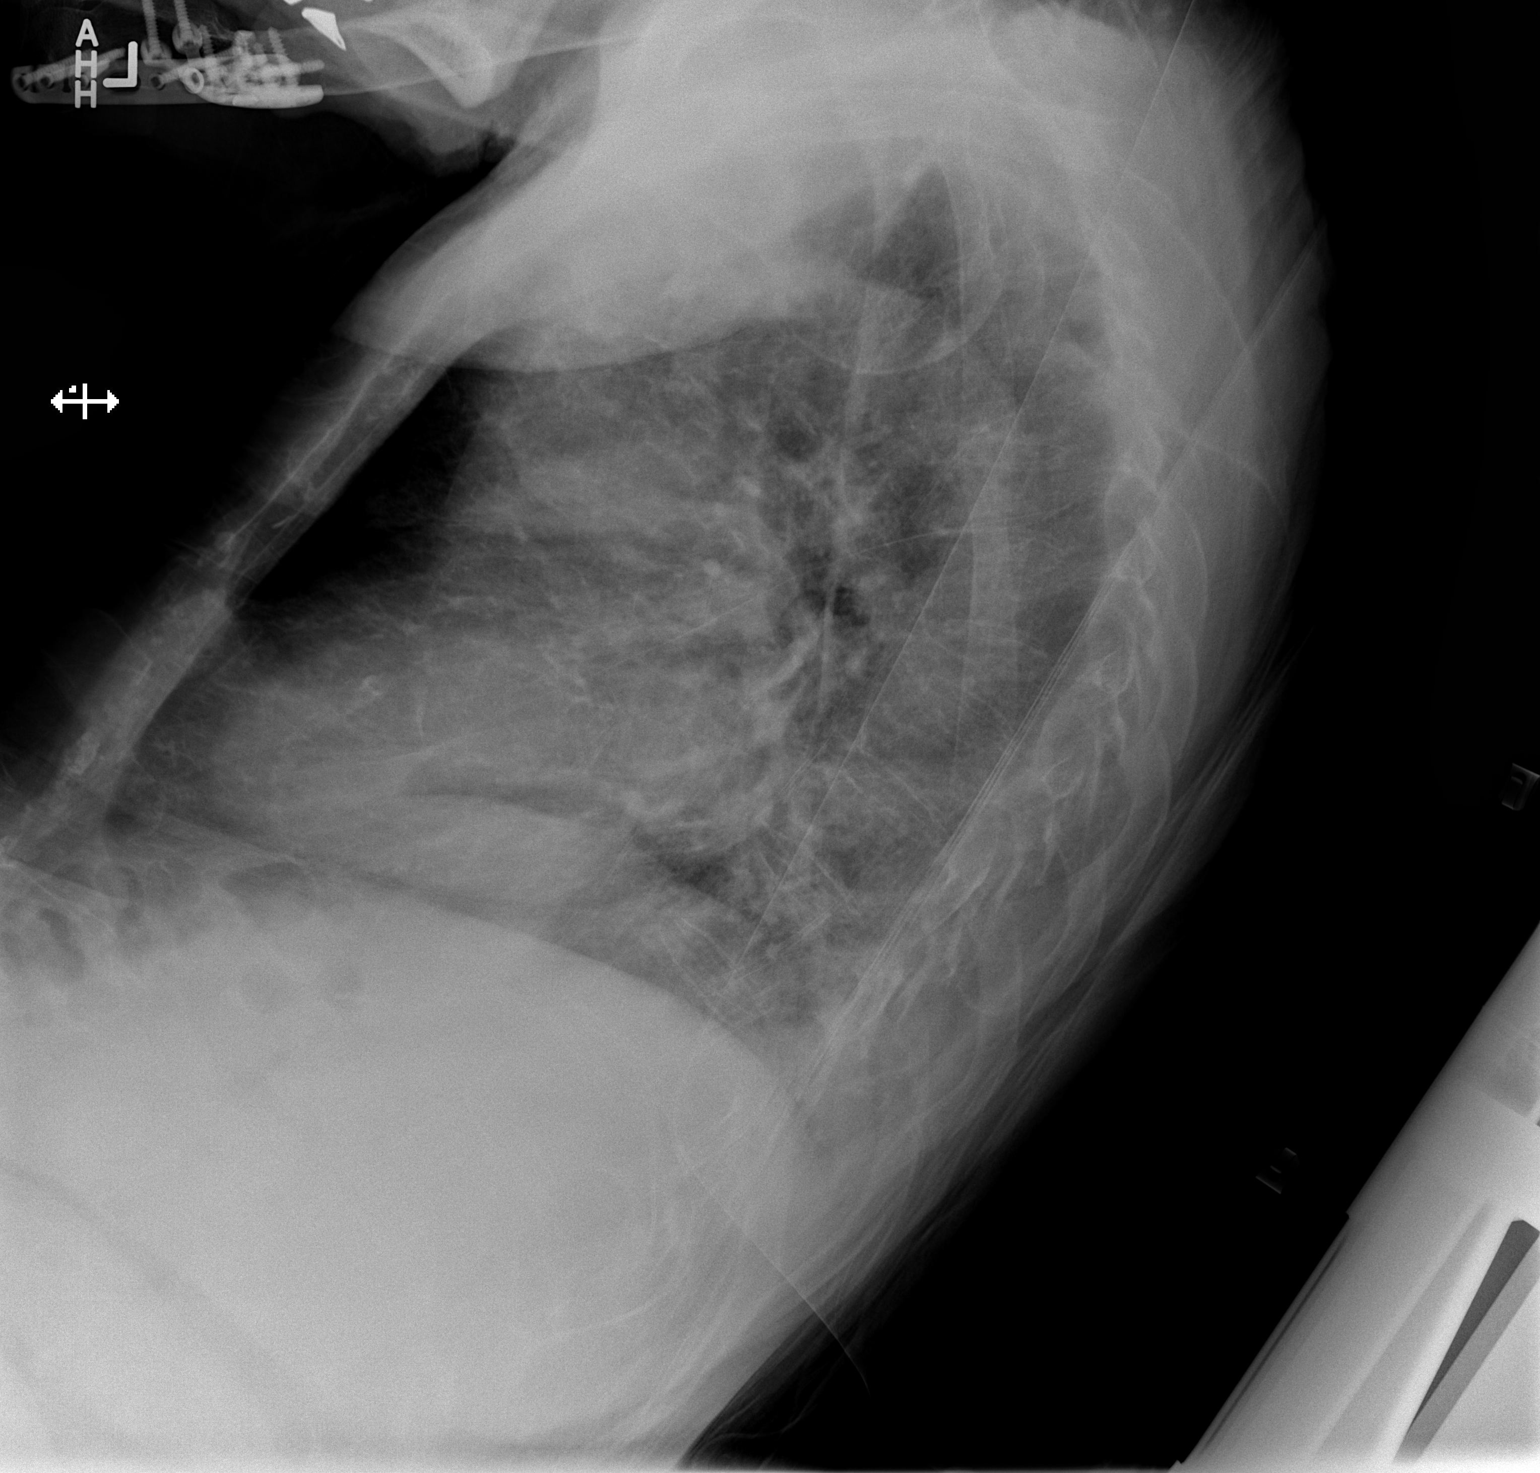

[x chest ap]
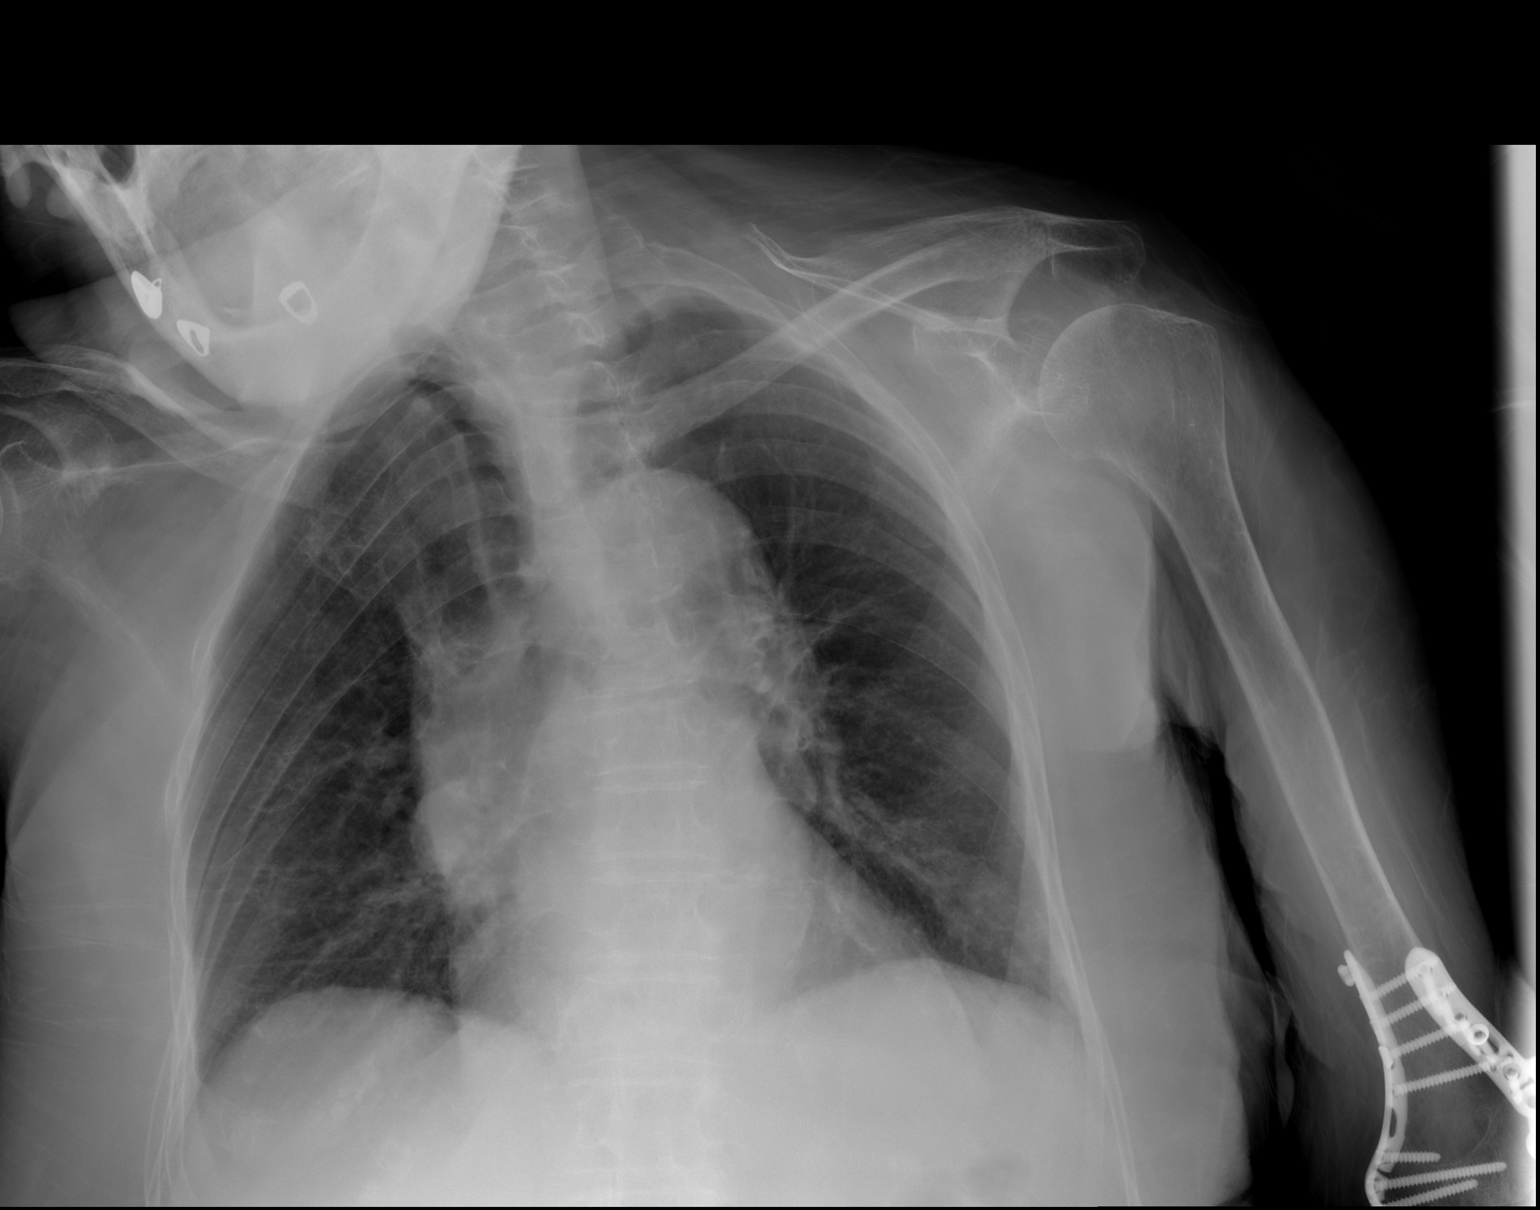

[2 of 2 positions shown; findings below may reference images not displayed]

FINDINGS: Lung volumes are low bilaterally. Stable bilateral pulmonary
scarring. There is no evidence of pulmonary edema, consolidation,
pneumothorax, nodule or pleural fluid. The heart size is stable.
There is stable tortuosity of the thoracic aorta. Visualized bony
structures show stable osteopenia and mild degenerative changes of
the thoracic spine without visible fracture.
IMPRESSION: No active disease.  Stable pulmonary scarring.
